# Patient Record
Sex: Male | Born: 1980
Health system: Southern US, Community
[De-identification: ages and names within clinical notes are randomized; demographics above are authoritative.]

## PROBLEM LIST (undated history)

## (undated) DIAGNOSIS — T7840XA Allergy, unspecified, initial encounter: Secondary | ICD-10-CM

## (undated) HISTORY — DX: Allergy, unspecified, initial encounter: T78.40XA

---

## 2002-02-14 ENCOUNTER — Emergency Department (HOSPITAL_COMMUNITY): Admission: EM | Admit: 2002-02-14 | Discharge: 2002-02-14 | Payer: Self-pay | Admitting: Emergency Medicine

## 2005-04-16 ENCOUNTER — Emergency Department (HOSPITAL_COMMUNITY): Admission: EM | Admit: 2005-04-16 | Discharge: 2005-04-16 | Payer: Self-pay | Admitting: Emergency Medicine

## 2010-01-01 ENCOUNTER — Emergency Department (HOSPITAL_COMMUNITY): Admission: EM | Admit: 2010-01-01 | Discharge: 2010-01-01 | Payer: Self-pay | Admitting: Family Medicine

## 2010-09-08 ENCOUNTER — Emergency Department (INDEPENDENT_AMBULATORY_CARE_PROVIDER_SITE_OTHER): Payer: Self-pay

## 2010-09-08 ENCOUNTER — Emergency Department (HOSPITAL_BASED_OUTPATIENT_CLINIC_OR_DEPARTMENT_OTHER)
Admission: EM | Admit: 2010-09-08 | Discharge: 2010-09-08 | Disposition: A | Discharge status: Home / Self Care | Payer: Self-pay | Attending: Emergency Medicine | Admitting: Emergency Medicine

## 2010-09-08 DIAGNOSIS — J189 Pneumonia, unspecified organism: Secondary | ICD-10-CM | POA: Insufficient documentation

## 2010-09-08 DIAGNOSIS — R059 Cough, unspecified: Secondary | ICD-10-CM

## 2010-09-08 DIAGNOSIS — R05 Cough: Secondary | ICD-10-CM | POA: Insufficient documentation

## 2010-09-08 DIAGNOSIS — R0989 Other specified symptoms and signs involving the circulatory and respiratory systems: Secondary | ICD-10-CM

## 2010-09-08 DIAGNOSIS — R509 Fever, unspecified: Secondary | ICD-10-CM

## 2010-09-15 ENCOUNTER — Emergency Department (HOSPITAL_BASED_OUTPATIENT_CLINIC_OR_DEPARTMENT_OTHER)
Admission: EM | Admit: 2010-09-15 | Discharge: 2010-09-15 | Disposition: A | Discharge status: Home / Self Care | Payer: Self-pay | Attending: Emergency Medicine | Admitting: Emergency Medicine

## 2010-09-15 DIAGNOSIS — J189 Pneumonia, unspecified organism: Secondary | ICD-10-CM | POA: Insufficient documentation

## 2011-06-17 ENCOUNTER — Emergency Department (INDEPENDENT_AMBULATORY_CARE_PROVIDER_SITE_OTHER): Payer: Self-pay

## 2011-06-17 ENCOUNTER — Emergency Department (HOSPITAL_BASED_OUTPATIENT_CLINIC_OR_DEPARTMENT_OTHER)
Admission: EM | Admit: 2011-06-17 | Discharge: 2011-06-17 | Disposition: A | Discharge status: Home / Self Care | Payer: Self-pay | Attending: Emergency Medicine | Admitting: Emergency Medicine

## 2011-06-17 ENCOUNTER — Other Ambulatory Visit: Payer: Self-pay

## 2011-06-17 ENCOUNTER — Encounter (HOSPITAL_BASED_OUTPATIENT_CLINIC_OR_DEPARTMENT_OTHER): Payer: Self-pay

## 2011-06-17 DIAGNOSIS — R0602 Shortness of breath: Secondary | ICD-10-CM | POA: Insufficient documentation

## 2011-06-17 DIAGNOSIS — R079 Chest pain, unspecified: Secondary | ICD-10-CM | POA: Insufficient documentation

## 2011-06-17 MED ORDER — IBUPROFEN 800 MG PO TABS
800.0000 mg | ORAL_TABLET | Freq: Once | ORAL | Status: AC
  Administered 2011-06-17: 800 mg via ORAL
  Filled 2011-06-17: qty 1

## 2011-06-17 NOTE — ED Notes (Signed)
C/o CP started last night-intermittent "for a few months"-states pain worse when "bends down"

## 2011-06-17 NOTE — ED Provider Notes (Signed)
This chart was scribed for Joya Gaskins, MD by Wallis Mart. The patient was seen in room MH11/MH11 and the patient's care was started at 6:32 PM.   CSN: [id number]  Arrival date & time 06/17/11  1738   First MD Initiated Contact with Patient 06/17/11 1802      Chief Complaint  Patient presents with  . Chest Pain     HPI  Pt seen at 6:30 PM Xavier Oconnor is a 31 y.o. male who presents to the Emergency Department complaining of persistence of constant, gradually worsening CP onset last night while sleeping. The pain radiates nowhere.  Turning certain ways and bending over worsens the pain. This is a recurrent problem, it comes and goes every couple of months. Pain is currently a level 4 but worsens when moving his upper body. Pain also  worsens while breathing deeply. Pt c/o some SOB. Pt has tried nothing to relieve the pain.  Denies sweating or syncope, fevers, coughing. Pt denies health problems, smoking, drug use. No h/o MI, blood clots. There are no other associated symptoms and no other alleviating or aggravating factors.    PMH - none  History reviewed. No pertinent past surgical history.  No family history on file.  History  Substance Use Topics  . Smoking status: Never Smoker   . Smokeless tobacco: Not on file  . Alcohol Use: Yes      Review of Systems 10 Systems reviewed and are negative for acute change except as noted in the HPI.  Allergies  Review of patient's allergies indicates no known allergies.  Home Medications  No current outpatient prescriptions on file.  BP 108/75  Pulse 71  Temp(Src) 98.5 F (36.9 C) (Oral)  Resp 16  Ht 5\' 7"  (1.702 m)  Wt 204 lb (92.534 kg)  BMI 31.95 kg/m2  SpO2 98%  Physical Exam CONSTITUTIONAL: Well developed/well nourished HEAD AND FACE: Normocephalic/atraumatic EYES: EOMI/PERRL ENMT: Mucous membranes moist NECK: supple no meningeal signs SPINE:entire spine nontender CV: S1/S2 noted, no  murmurs/rubs/gallops noted LUNGS: Lungs are clear to auscultation bilaterally, no apparent distress CHEST: tenderness with movement of upper body ABDOMEN: soft, nontender, no rebound or guarding GU:no cva tenderness NEURO: Pt is awake/alert, moves all extremitiesx4 EXTREMITIES: pulses normal, full ROM, no edema noted SKIN: warm, color normal PSYCH: no abnormalities of mood noted  ED Course  Procedures  DIAGNOSTIC STUDIES: Oxygen Saturation is 98% on room air, normal by my interpretation.    COORDINATION OF CARE:   Pt well appearing, no distress Doubt ACS/PE/Dissection His pain is reproducible  The patient appears reasonably screened and/or stabilized for discharge and I doubt any other medical condition or other [facility] requiring further screening, evaluation, or treatment in the ED at this time prior to discharge.   MDM  Nursing notes reviewed and considered in documentation xrays reviewed and considered    Date: 06/17/2011  Rate: 58  Rhythm: normal sinus rhythm  QRS Axis: normal  Intervals: normal  ST/T Wave abnormalities: normal  Conduction Disutrbances:none  Narrative Interpretation:   Old EKG Reviewed: unchanged     I personally performed the services described in this documentation, which was scribed in my presence. The recorded information has been reviewed and considered.       Joya Gaskins, MD 06/17/11 1929

## 2011-06-17 NOTE — Discharge Instructions (Signed)

## 2011-08-26 ENCOUNTER — Emergency Department (HOSPITAL_BASED_OUTPATIENT_CLINIC_OR_DEPARTMENT_OTHER)
Admission: EM | Admit: 2011-08-26 | Discharge: 2011-08-26 | Disposition: A | Discharge status: Home / Self Care | Payer: Self-pay | Attending: Emergency Medicine | Admitting: Emergency Medicine

## 2011-08-26 ENCOUNTER — Encounter (HOSPITAL_BASED_OUTPATIENT_CLINIC_OR_DEPARTMENT_OTHER): Payer: Self-pay | Admitting: *Deleted

## 2011-08-26 DIAGNOSIS — R197 Diarrhea, unspecified: Secondary | ICD-10-CM | POA: Insufficient documentation

## 2011-08-26 LAB — CBC
HCT: 41 % (ref 39.0–52.0)
Hemoglobin: 14.8 g/dL (ref 13.0–17.0)
MCH: 30.6 pg (ref 26.0–34.0)
MCHC: 36.1 g/dL — ABNORMAL HIGH (ref 30.0–36.0)
MCV: 84.7 fL (ref 78.0–100.0)
Platelets: 230 10*3/uL (ref 150–400)
RBC: 4.84 MIL/uL (ref 4.22–5.81)
RDW: 12.4 % (ref 11.5–15.5)
WBC: 9.9 10*3/uL (ref 4.0–10.5)

## 2011-08-26 LAB — COMPREHENSIVE METABOLIC PANEL
ALT: 33 U/L (ref 0–53)
AST: 21 U/L (ref 0–37)
Albumin: 4.3 g/dL (ref 3.5–5.2)
Alkaline Phosphatase: 87 U/L (ref 39–117)
BUN: 13 mg/dL (ref 6–23)
CO2: 28 mEq/L (ref 19–32)
Calcium: 9.7 mg/dL (ref 8.4–10.5)
Chloride: 102 mEq/L (ref 96–112)
Creatinine, Ser: 1 mg/dL (ref 0.50–1.35)
GFR calc Af Amer: >90 mL/min (ref >90)
GFR calc non Af Amer: >90 mL/min (ref >90)
Glucose, Bld: 111 mg/dL — ABNORMAL HIGH (ref 70–99)
Potassium: 4.3 meq/L (ref 3.5–5.1)
Sodium: 139 mEq/L (ref 135–145)
Total Bilirubin: 0.7 mg/dL (ref 0.3–1.2)
Total Protein: 7.5 g/dL (ref 6.0–8.3)

## 2011-08-26 LAB — URINE MICROSCOPIC-ADD ON

## 2011-08-26 LAB — URINALYSIS, ROUTINE W REFLEX MICROSCOPIC
Bilirubin Urine: NEGATIVE
Glucose, UA: NEGATIVE mg/dL
Hgb urine dipstick: NEGATIVE
Ketones, ur: 15 mg/dL — AB
Nitrite: NEGATIVE
Protein, ur: NEGATIVE mg/dL
Specific Gravity, Urine: 1.03 (ref 1.005–1.030)
Urobilinogen, UA: 0.2 mg/dL (ref 0.0–1.0)
pH: 6.5 (ref 5.0–8.0)

## 2011-08-26 LAB — DIFFERENTIAL
Basophils Absolute: 0 10*3/uL (ref 0.0–0.1)
Basophils Relative: 0 % (ref 0–1)
Eosinophils Absolute: 0.1 10*3/uL (ref 0.0–0.7)
Eosinophils Relative: 1 % (ref 0–5)
Lymphocytes Relative: 11 % — ABNORMAL LOW (ref 12–46)
Lymphs Abs: 1.1 10*3/uL (ref 0.7–4.0)
Monocytes Absolute: 1.1 10*3/uL — ABNORMAL HIGH (ref 0.1–1.0)
Monocytes Relative: 11 % (ref 3–12)
Neutro Abs: 7.5 10*3/uL (ref 1.7–7.7)
Neutrophils Relative %: 76 % (ref 43–77)

## 2011-08-26 LAB — LIPASE, BLOOD: Lipase: 13 U/L (ref 11–59)

## 2011-08-26 MED ORDER — ONDANSETRON 8 MG PO TBDP: 8.0000 mg | ORAL_TABLET | Freq: Three times a day (TID) | ORAL | Status: AC | PRN

## 2011-08-26 MED ORDER — ONDANSETRON HCL 4 MG/2ML IJ SOLN
4.0000 mg | Freq: Once | INTRAMUSCULAR | Status: AC
  Administered 2011-08-26: 4 mg via INTRAVENOUS
  Filled 2011-08-26: qty 2

## 2011-08-26 MED ORDER — ONDANSETRON HCL 4 MG/2ML IJ SOLN: 4.0000 mg | Freq: Once | INTRAMUSCULAR | Status: DC

## 2011-08-26 MED ORDER — SODIUM CHLORIDE 0.9 % IV BOLUS (SEPSIS)
1000.0000 mL | Freq: Once | INTRAVENOUS | Status: AC
  Administered 2011-08-26: 1000 mL via INTRAVENOUS

## 2011-08-26 NOTE — ED Notes (Signed)
Pt reports having diarrhea since 2030 last evening. Pt reports having 8 episodes of diarrhea. Reports mid abdominal pain, squeezing in nature and is 6/10. Pt reports nausea, but no vomiting. Pt reports not taking any OTC meds for the diarrhea or abdominal pain.

## 2011-08-26 NOTE — ED Provider Notes (Signed)
History     CSN: [id number]  Arrival date & time 08/26/11  4098   First MD Initiated Contact with Patient 08/26/11 0528      No chief complaint on file.   (Consider location/radiation/quality/duration/timing/severity/associated sxs/prior treatment) HPI Patient is a 31 year old male who presents today complaining of having 6-8 bowel movements since 10:30 last night. Patient began having abdominal pain that he described as a cramping sensation around 8 PM. He the decubitus steak for dinner and no one else to eat the same meal had any symptoms. The patient did note that he had also eaten some ice cream which no one else ate and his symptoms did begin after this. The patient endorses having intermittent crampy abdominal pain that he described as being an 8/10. He has had nausea but no vomiting. Patient noted that he had had a small amount of blood on the toilet paper but no significant blood noted in his stool in the toilet. Patient denies any sick contacts. He's had no fevers. On presentation patient is hemodynamically stable. Patient has no history of abdominal surgery. He denies any urinary symptoms. Currently patient has pain is only a 3/10. There are no other associated or modifying factors. History reviewed. No pertinent past medical history.  History reviewed. No pertinent past surgical history.  History reviewed. No pertinent family history.  History  Substance Use Topics  . Smoking status: Never Smoker   . Smokeless tobacco: Not on file  . Alcohol Use: Yes      Review of Systems  Constitutional: Negative.   HENT: Negative.   Eyes: Negative.   Respiratory: Negative.   Cardiovascular: Negative.   Gastrointestinal: Positive for nausea, abdominal pain and diarrhea. Negative for vomiting.  Genitourinary: Negative.   Musculoskeletal: Negative.   Skin: Negative.   Neurological: Negative.   Hematological: Negative.   Psychiatric/Behavioral: Negative.   All other systems  reviewed and are negative.    Allergies  Review of patient's allergies indicates no known allergies.  Home Medications  No current outpatient prescriptions on file.  BP 127/70  Pulse 67  Temp(Src) 98.5 F (36.9 C) (Oral)  Resp 16  Ht 5\' 7"  (1.702 m)  Wt 200 lb (90.719 kg)  BMI 31.32 kg/m2  SpO2 97%  Physical Exam  Nursing note and vitals reviewed. GEN: Well-developed, well-nourished male in no distress HEENT: Atraumatic, normocephalic. Oropharynx clear without erythema EYES: PERRLA BL, no scleral icterus. NECK: Trachea midline, no meningismus CV: regular rate and rhythm. No murmurs, rubs, or gallops PULM: No respiratory distress.  No crackles, wheezes, or rales. GI: soft, non-tender. No guarding, rebound, or tenderness. + bowel sounds  GU: deferred Neuro: cranial nerves 2-12 intact, no abnormalities of strength or sensation, A and O x 3 MSK: Patient moves all 4 extremities symmetrically, no deformity, edema, or injury noted Skin: No rashes petechiae, purpura, or jaundice Psych: no abnormality of mood   ED Course  Procedures (including critical care time)  Labs Reviewed  CBC - Abnormal; Notable for the following:    MCHC 36.1 (*)    All other components within normal limits  DIFFERENTIAL - Abnormal; Notable for the following:    Lymphocytes Relative 11 (*)    Monocytes Absolute 1.1 (*)    All other components within normal limits  COMPREHENSIVE METABOLIC PANEL - Abnormal; Notable for the following:    Glucose, Bld 111 (*)    All other components within normal limits  URINALYSIS, ROUTINE W REFLEX MICROSCOPIC - Abnormal; Notable for the following:  Ketones, ur 15 (*)    Leukocytes, UA SMALL (*)    All other components within normal limits  LIPASE, BLOOD  URINE MICROSCOPIC-ADD ON   No results found.   1. Diarrhea       MDM  Patient was evaluated by myself. Based on evaluation patient was given IV fluids as well as a dose of Zofran. Abdominal pain workup  with labs was performed. Patient had normal renal panel, liver panel, lipase. Urinalysis showed 15 ketones but no significant signs of infection. Patient did have 11-20 white blood cells in his urine but urine showed only small leuk esterase. Patient did not have any urinary symptoms. Following hydration patient was feeling better. He had no abdominal pain.  The patient remained hemodynamically stable. During his hour and a half in the emergency department he did not have a single episode of diarrhea. We discussed that his symptoms may have been due to lactose intolerance from the ice cream he ate or possibly from an episode of food poisoning associated with this.  As he did not have vomiting I did not feel this was a gastroenteritis. Patient was not felt to have a surgical process today. Imaging was not warranted on this visit. Patient had discussed that he could be discharged home with a prescription for Zofran if needed for his nausea. He was not nauseous at discharge. Patient is welcome to return if he has worsening of his symptoms or other emergent concerns.        Cyndra Numbers, MD 08/26/11 [phone]

## 2011-08-26 NOTE — Discharge Instructions (Signed)
Diarrhea Infections caused by germs (bacterial) or a virus commonly cause diarrhea. Your caregiver has determined that with time, rest and fluids, the diarrhea should improve. In general, eat normally while drinking more water than usual. Although water may prevent dehydration, it does not contain salt and minerals (electrolytes). Broths, weak tea without caffeine and oral rehydration solutions (ORS) replace fluids and electrolytes. Small amounts of fluids should be taken frequently. Large amounts at one time may not be tolerated. Plain water may be harmful in infants and the elderly. Oral rehydrating solutions (ORS) are available at pharmacies and grocery stores. ORS replace water and important electrolytes in proper proportions. Sports drinks are not as effective as ORS and may be harmful due to sugars worsening diarrhea.  ORS is especially recommended for use in children with diarrhea. As a general guideline for children, replace any new fluid losses from diarrhea and/or vomiting with ORS as follows:   If your child weighs 22 pounds or under (10 kg or less), give 60-120 mL ( -  cup or 2 - 4 ounces) of ORS for each episode of diarrheal stool or vomiting episode.   If your child weighs more than 22 pounds (more than 10 kgs), give 120-240 mL ( - 1 cup or 4 - 8 ounces) of ORS for each diarrheal stool or episode of vomiting.   While correcting for dehydration, children should eat normally. However, foods high in sugar should be avoided because this may worsen diarrhea. Large amounts of carbonated soft drinks, juice, gelatin desserts and other highly sugared drinks should be avoided.   After correction of dehydration, other liquids that are appealing to the child may be added. Children should drink small amounts of fluids frequently and fluids should be increased as tolerated. Children should drink enough fluids to keep urine clear or pale yellow.   Adults should eat normally while drinking more fluids  than usual. Drink small amounts of fluids frequently and increase as tolerated. Drink enough fluids to keep urine clear or pale yellow. Broths, weak decaffeinated tea, lemon lime soft drinks (allowed to go flat) and ORS replace fluids and electrolytes.   Avoid:   Carbonated drinks.   Juice.   Extremely hot or cold fluids.   Caffeine drinks.   Fatty, greasy foods.   Alcohol.   Tobacco.   Too much intake of anything at one time.   Gelatin desserts.   Probiotics are active cultures of beneficial bacteria. They may lessen the amount and number of diarrheal stools in adults. Probiotics can be found in yogurt with active cultures and in supplements.   Wash hands well to avoid spreading bacteria and virus.   Anti-diarrheal medications are not recommended for infants and children.   Only take over-the-counter or prescription medicines for pain, discomfort or fever as directed by your caregiver. Do not give aspirin to children because it may cause Reye's Syndrome.   For adults, ask your caregiver if you should continue all prescribed and over-the-counter medicines.   If your caregiver has given you a follow-up appointment, it is very important to keep that appointment. Not keeping the appointment could result in a chronic or permanent injury, and disability. If there is any problem keeping the appointment, you must call back to this facility for assistance.  SEEK IMMEDIATE MEDICAL CARE IF:   You or your child is unable to keep fluids down or other symptoms or problems become worse in spite of treatment.   Vomiting or diarrhea develops and becomes persistent.     There is vomiting of blood or bile (green material).   There is blood in the stool or the stools are black and tarry.   There is no urine output in 6-8 hours or there is only a small amount of very dark urine.   Abdominal pain develops, increases or localizes.   You have a fever.   Your baby is older than 3 months with a  rectal temperature of 102 F (38.9 C) or higher.   Your baby is 3 months old or younger with a rectal temperature of 100.4 F (38 C) or higher.   You or your child develops excessive weakness, dizziness, fainting or extreme thirst.   You or your child develops a rash, stiff neck, severe headache or become irritable or sleepy and difficult to awaken.  MAKE SURE YOU:   Understand these instructions.   Will watch your condition.   Will get help right away if you are not doing well or get worse.  Document Released: 03/15/2002 Document Revised: 03/14/2011 Document Reviewed: 01/30/2009 [employer] Patient Information 2012 [employer]. 

## 2011-08-26 NOTE — ED Notes (Signed)
Pt reports 6-8 episodes of diarrhea since 2030 last evening. States that he has not taken anything for the diarrhea. Pt reports nausea but no vomiting.

## 2012-01-12 ENCOUNTER — Encounter (HOSPITAL_BASED_OUTPATIENT_CLINIC_OR_DEPARTMENT_OTHER): Payer: Self-pay

## 2012-01-12 ENCOUNTER — Emergency Department (HOSPITAL_BASED_OUTPATIENT_CLINIC_OR_DEPARTMENT_OTHER)
Admission: EM | Admit: 2012-01-12 | Discharge: 2012-01-13 | Disposition: A | Discharge status: Home / Self Care | Payer: Self-pay | Attending: Emergency Medicine | Admitting: Emergency Medicine

## 2012-01-12 DIAGNOSIS — X500XXA Overexertion from strenuous movement or load, initial encounter: Secondary | ICD-10-CM | POA: Insufficient documentation

## 2012-01-12 DIAGNOSIS — S339XXA Sprain of unspecified parts of lumbar spine and pelvis, initial encounter: Secondary | ICD-10-CM | POA: Insufficient documentation

## 2012-01-12 DIAGNOSIS — S39012A Strain of muscle, fascia and tendon of lower back, initial encounter: Secondary | ICD-10-CM

## 2012-01-12 DIAGNOSIS — R29898 Other symptoms and signs involving the musculoskeletal system: Secondary | ICD-10-CM | POA: Insufficient documentation

## 2012-01-12 DIAGNOSIS — M5380 Other specified dorsopathies, site unspecified: Secondary | ICD-10-CM | POA: Insufficient documentation

## 2012-01-12 MED ORDER — OXYCODONE-ACETAMINOPHEN 7.5-325 MG PO TABS: 1.0000 | ORAL_TABLET | ORAL | Status: DC | PRN

## 2012-01-12 MED ORDER — DIAZEPAM 10 MG PO TABS: 10.0000 mg | ORAL_TABLET | Freq: Three times a day (TID) | ORAL | Status: DC | PRN

## 2012-01-12 MED ORDER — NAPROXEN 500 MG PO TABS: 500.0000 mg | ORAL_TABLET | Freq: Two times a day (BID) | ORAL | Status: DC

## 2012-01-12 MED ORDER — KETOROLAC TROMETHAMINE 60 MG/2ML IM SOLN
60.0000 mg | Freq: Once | INTRAMUSCULAR | Status: AC
  Administered 2012-01-12: 60 mg via INTRAMUSCULAR

## 2012-01-12 MED ORDER — IBUPROFEN 800 MG PO TABS
800.0000 mg | ORAL_TABLET | Freq: Once | ORAL | Status: AC
Start: 2012-01-12 — End: 2012-01-12
  Administered 2012-01-12: 800 mg via ORAL
  Filled 2012-01-12: qty 1

## 2012-01-12 MED ORDER — CYCLOBENZAPRINE HCL 10 MG PO TABS
10.0000 mg | ORAL_TABLET | Freq: Once | ORAL | Status: AC
  Administered 2012-01-12: 10 mg via ORAL
  Filled 2012-01-12: qty 1

## 2012-01-12 MED ORDER — OXYCODONE-ACETAMINOPHEN 5-325 MG PO TABS
1.0000 | ORAL_TABLET | Freq: Once | ORAL | Status: AC
  Administered 2012-01-12: 1 via ORAL
  Filled 2012-01-12 (×2): qty 1

## 2012-01-12 MED ORDER — KETOROLAC TROMETHAMINE 60 MG/2ML IM SOLN
INTRAMUSCULAR | Status: AC
  Filled 2012-01-12: qty 2

## 2012-01-12 MED ORDER — OXYCODONE-ACETAMINOPHEN 5-325 MG PO TABS
1.0000 | ORAL_TABLET | Freq: Once | ORAL | Status: AC
  Administered 2012-01-13: 1 via ORAL
  Filled 2012-01-12 (×2): qty 1

## 2012-01-12 NOTE — ED Provider Notes (Signed)
History  This chart was scribed for Xavier Booze, MD by Lynelle Smoke and Shari Heritage. The patient was seen in room MHT13/MHT13. Patient's care was started at 2232.   CSN: [id number]  Arrival date & time 01/12/12  1941   First MD Initiated Contact with Patient 01/12/12 2232      Chief Complaint  Patient presents with  . Back Injury    The history is provided by the patient. No language interpreter was used.    HPI Comments: Xavier Oconnor is a 31 y.o. male who presents to the Emergency Department complaining of moderate, constant, non-radiating mid lower back pain onset 5.5 hours ago. There is associated mild leg weakness and tingling. Patient states that he strained a muscle while playing with his kids. Patient rates the pain as 4/10 when he is at rest. Patient denies urinary incontinence or bowel incontinence. Patient was transported to the ED by EMS because he had trouble standing and moving freely after the incident. Patient says that movement worsens the pain. Patient states that he has a history of back pain. Patient does not smoke.  History reviewed. No pertinent past medical history.  History reviewed. No pertinent past surgical history.  No family history on file.  History  Substance Use Topics  . Smoking status: Never Smoker   . Smokeless tobacco: Not on file  . Alcohol Use: Yes      Review of Systems  Musculoskeletal: Positive for back pain.  Neurological: Positive for weakness.  All other systems reviewed and are negative.    Allergies  Review of patient's allergies indicates no known allergies.  Home Medications  No current outpatient prescriptions on file.  BP 124/80  Pulse 84  Temp 98.3 F (36.8 C) (Oral)  Resp 20  SpO2 94%  Physical Exam  Nursing note and vitals reviewed. Constitutional: He is oriented to person, place, and time. He appears well-developed and well-nourished.  HENT:  Head: Normocephalic and atraumatic.  Neck: Normal range of  motion. Neck supple.  Musculoskeletal: He exhibits tenderness.       Lumbar back: He exhibits tenderness and spasm.       Moderate left paralumbar spasm with tenderness. Mild tenderness lower lumbar spine. Positive straight leg raise bilaterally at 30 degrees.  Neurological: He is alert and oriented to person, place, and time.  Skin: Skin is warm and dry.  Psychiatric: He has a normal mood and affect. His behavior is normal.    ED Course  Procedures (including critical care time) DIAGNOSTIC STUDIES: Oxygen Saturation is 94% on room air, adequate by my interpretation.    COORDINATION OF CARE: 10:39pm- Patient informed of current plan for treatment and evaluation and agrees with plan at this time.     1. Lumbosacral strain       MDM  Acute lumbar strain with significant muscle spasm. There is no indication for imaging at this point. He is given a dose of ibuprofen, Percocet, and Flexeril and will be reevaluated.  He states that he got only slight relief for the above-noted medications. He is given an additional dose of Percocet and he is discharged with prescriptions for Percocet 7.5-325, diazepam, and naproxen.      I personally performed the services described in this documentation, which was scribed in my presence. The recorded information has been reviewed and considered.      Xavier Booze, MD 01/13/12 Marlyne Beards

## 2012-01-12 NOTE — ED Notes (Signed)
Patient arrived by EMS after complaining of lower backpain after playing and wrestling with children. Patient reports that he twisted the wrong way and is now having severe back pain, increased pain with any movement, no neuro deficits

## 2012-01-13 NOTE — Discharge Instructions (Signed)
Back Pain, Adult Low back pain is very common. About 1 in 5 people have back pain.The cause of low back pain is rarely dangerous. The pain often gets better over time.About half of people with a sudden onset of back pain feel better in just 2 weeks. About 8 in 10 people feel better by 6 weeks.  CAUSES Some common causes of back pain include:  Strain of the muscles or ligaments supporting the spine.  Wear and tear (degeneration) of the spinal discs.  Arthritis.  Direct injury to the back. DIAGNOSIS Most of the time, the direct cause of low back pain is not known.However, back pain can be treated effectively even when the exact cause of the pain is unknown.Answering your caregiver's questions about your overall health and symptoms is one of the most accurate ways to make sure the cause of your pain is not dangerous. If your caregiver needs more information, he or she may order lab work or imaging tests (X-rays or MRIs).However, even if imaging tests show changes in your back, this usually does not require surgery. HOME CARE INSTRUCTIONS For many people, back pain returns.Since low back pain is rarely dangerous, it is often a condition that people can learn to [facility] their own.   Remain active. It is stressful on the back to sit or stand in one place. Do not sit, drive, or stand in one place for more than 30 minutes at a time. Take short walks on level surfaces as soon as pain allows.Try to increase the length of time you walk each day.  Do not stay in bed.Resting more than 1 or 2 days can delay your recovery.  Do not avoid exercise or work.Your body is made to move.It is not dangerous to be active, even though your back may hurt.Your back will likely heal faster if you return to being active before your pain is gone.  Pay attention to your body when you bend and lift. Many people have less discomfortwhen lifting if they bend their knees, keep the load close to their bodies,and  avoid twisting. Often, the most comfortable positions are those that put less stress on your recovering back.  Find a comfortable position to sleep. Use a firm mattress and lie on your side with your knees slightly bent. If you lie on your back, put a pillow under your knees.  Only take over-the-counter or prescription medicines as directed by your caregiver. Over-the-counter medicines to reduce pain and inflammation are often the most helpful.Your caregiver may prescribe muscle relaxant drugs.These medicines help dull your pain so you can more quickly return to your normal activities and healthy exercise.  Put ice on the injured area.  Put ice in a plastic bag.  Place a towel between your skin and the bag.  Leave the ice on for 15 to 20 minutes, 3 to 4 times a day for the first 2 to 3 days. After that, ice and heat may be alternated to reduce pain and spasms.  Ask your caregiver about trying back exercises and gentle massage. This may be of some benefit.  Avoid feeling anxious or stressed.Stress increases muscle tension and can worsen back pain.It is important to recognize when you are anxious or stressed and learn ways to manage it.Exercise is a great option. SEEK MEDICAL CARE IF:  You have pain that is not relieved with rest or medicine.  You have pain that does not improve in 1 week.  You have new symptoms.  You are generally  not feeling well. SEEK IMMEDIATE MEDICAL CARE IF:   You have pain that radiates from your back into your legs.  You develop new bowel or bladder control problems.  You have unusual weakness or numbness in your arms or legs.  You develop nausea or vomiting.  You develop abdominal pain.  You feel faint. Document Released: 03/25/2005 Document Revised: 09/24/2011 Document Reviewed: 08/13/2010 [facility] Patient Information 2013 [place], [place].  Naproxen and naproxen sodium oral immediate-release tablets What is this medicine? NAPROXEN (na PROX en)  is a non-steroidal anti-inflammatory drug (NSAID). It is used to reduce swelling and to treat pain. This medicine may be used for dental pain, headache, or painful monthly periods. It is also used for painful joint and muscular problems such as arthritis, tendinitis, bursitis, and gout. This medicine may be used for other purposes; ask your health care provider or pharmacist if you have questions. What should I tell my health care provider before I take this medicine? They need to know if you have any of these conditions: -asthma -cigarette smoker -drink more than 3 alcohol containing drinks a day -heart disease or circulation problems such as heart failure or leg edema (fluid retention) -high blood pressure -kidney disease -liver disease -stomach bleeding or ulcers -an unusual or allergic reaction to naproxen, aspirin, other NSAIDs, other medicines, foods, dyes, or preservatives -pregnant or trying to get pregnant -breast-feeding How should I use this medicine? Take this medicine by mouth with a glass of water. Follow the directions on the prescription label. Take it with food if your stomach gets upset. Try to not lie down for at least 10 minutes after you take it. Take your medicine at regular intervals. Do not take your medicine more often than directed. Long-term, continuous use may increase the risk of heart attack or stroke. A special MedGuide will be given to you by the pharmacist with each prescription and refill. Be sure to read this information carefully each time. Talk to your pediatrician regarding the use of this medicine in children. Special care may be needed. Overdosage: If you think you have taken too much of this medicine contact a poison control center or emergency room at once. NOTE: This medicine is only for you. Do not share this medicine with others. What if I miss a dose? If you miss a dose, take it as soon as you can. If it is almost time for your next dose, take only  that dose. Do not take double or extra doses. What may interact with this medicine? -alcohol -aspirin -cidofovir -diuretics -lithium -methotrexate -other drugs for inflammation like ketorolac or prednisone -pemetrexed -probenecid -warfarin This list may not describe all possible interactions. Give your health care provider a list of all the medicines, herbs, non-prescription drugs, or dietary supplements you use. Also tell them if you smoke, drink alcohol, or use illegal drugs. Some items may interact with your medicine. What should I watch for while using this medicine? Tell your doctor or health care professional if your pain does not get better. Talk to your doctor before taking another medicine for pain. Do not treat yourself. This medicine does not prevent heart attack or stroke. In fact, this medicine may increase the chance of a heart attack or stroke. The chance may increase with longer use of this medicine and in people who have heart disease. If you take aspirin to prevent heart attack or stroke, talk with your doctor or health care professional. Do not take other medicines that contain aspirin,  ibuprofen, or naproxen with this medicine. Side effects such as stomach upset, nausea, or ulcers may be more likely to occur. Many medicines available without a prescription should not be taken with this medicine. This medicine can cause ulcers and bleeding in the stomach and intestines at any time during treatment. Do not smoke cigarettes or drink alcohol. These increase irritation to your stomach and can make it more susceptible to damage from this medicine. Ulcers and bleeding can happen without warning symptoms and can cause death. You may get drowsy or dizzy. Do not drive, use machinery, or do anything that needs mental alertness until you know how this medicine affects you. Do not stand or sit up quickly, especially if you are an older patient. This reduces the risk of dizzy or fainting  spells. This medicine can cause you to bleed more easily. Try to avoid damage to your teeth and gums when you brush or floss your teeth. What side effects may I notice from receiving this medicine? Side effects that you should report to your doctor or health care professional as soon as possible: -black or bloody stools, blood in the urine or vomit -blurred vision -chest pain -difficulty breathing or wheezing -nausea or vomiting -severe stomach pain -skin rash, skin redness, blistering or peeling skin, hives, or itching -slurred speech or weakness on one side of the body -swelling of eyelids, throat, lips -unexplained weight gain or swelling -unusually weak or tired -yellowing of eyes or skin Side effects that usually do not require medical attention (report to your doctor or health care professional if they continue or are bothersome): -constipation -headache -heartburn This list may not describe all possible side effects. Call your doctor for medical advice about side effects. You may report side effects to FDA at 1-800-FDA-1088. Where should I keep my medicine? Keep out of the reach of children. Store at room temperature between 15 and 30 degrees C (59 and 86 degrees F). Keep container tightly closed. Throw away any unused medicine after the expiration date. NOTE: This sheet is a summary. It may not cover all possible information. If you have questions about this medicine, talk to your doctor, pharmacist, or health care provider.  2012, [employer] Standard. (03/27/2009 8:10:16 PM)  Diazepam tablets What is this medicine? DIAZEPAM (dye AZ e pam) is a benzodiazepine. It is used to treat anxiety and nervousness. It also can help treat alcohol withdrawal, relax muscles, and treat certain types of seizures. This medicine may be used for other purposes; ask your health care provider or pharmacist if you have questions. What should I tell my health care provider before I take this  medicine? They need to know if you have any of these conditions -an alcohol or drug abuse problem -bipolar disorder, depression, psychosis or other mental health condition -glaucoma -kidney or liver disease -lung or breathing disease -myasthenia gravis -Parkinson's disease -seizures or a history of seizures -suicidal thoughts -an unusual or allergic reaction to diazepam, other benzodiazepines, foods, dyes, or preservatives -pregnant or trying to get pregnant -breast-feeding How should I use this medicine? Take this medicine by mouth with a glass of water. Follow the directions on the prescription label. If this medicine upsets your stomach, take it with food or milk. Take your doses at regular intervals. Do not take your medicine more often than directed. If you have been taking this medicine regularly for some time, do not suddenly stop taking it. You must gradually reduce the dose or you may get severe side  effects. Ask your doctor or health care professional for advice. Even after you stop taking this medicine it can still affect your body for several days. Talk to your pediatrician regarding the use of this medicine in children. Special care may be needed. Overdosage: If you think you have taken too much of this medicine contact a poison control center or emergency room at once. NOTE: This medicine is only for you. Do not share this medicine with others. What if I miss a dose? If you miss a dose, take it as soon as you can. If it is almost time for your next dose, take only that dose. Do not take double or extra doses. What may interact with this medicine? -cimetidine -grapefruit juice -herbal or dietary supplements like kava kava, melatonin, St. John's Wort, or valerian -medicines for anxiety or sleeping problems, like alprazolam, lorazepam, or triazolam -medicines for depression, mental problems or psychiatric disturbances -medicines for HIV infection or AIDS -prescription pain  medicines -rifampin, rifapentine, or rifabutin -some medicines for seizures like carbamazepine, phenobarbital, phenytoin, or primidone This list may not describe all possible interactions. Give your health care provider a list of all the medicines, herbs, non-prescription drugs, or dietary supplements you use. Also tell them if you smoke, drink alcohol, or use illegal drugs. Some items may interact with your medicine. What should I watch for while using this medicine? Visit your doctor or health care professional for regular checks on your progress. Your body can become dependent on this medicine. Ask your doctor or health care professional if you still need to take it. You may get drowsy or dizzy. Do not drive, use machinery, or do anything that needs mental alertness until you know how this medicine affects you. To reduce the risk of dizzy and fainting spells, do not stand or sit up quickly, especially if you are an older patient. Alcohol may increase dizziness and drowsiness. Avoid alcoholic drinks. Do not treat yourself for coughs, colds or allergies without asking your doctor or health care professional for advice. Some ingredients can increase possible side effects. What side effects may I notice from receiving this medicine? Side effects that you should report to your doctor or health care professional as soon as possible: -allergic reactions like skin rash, itching or hives, swelling of the face, lips, or tongue -angry, confused, depressed, other mood changes -breathing problems -feeling faint or lightheaded, falls -muscle cramps -problems with balance, talking, walking -restlessness -tremors -trouble passing urine or change in the amount of urine -unusually weak or tired Side effects that usually do not require medical attention (report to your doctor or health care professional if they continue or are bothersome): -difficulty sleeping, nightmares -dizziness, drowsiness, clumsiness, or  unsteadiness, a hangover effect -headache -nausea, vomiting This list may not describe all possible side effects. Call your doctor for medical advice about side effects. You may report side effects to FDA at 1-800-FDA-1088. Where should I keep my medicine? Keep out of the reach of children. This medicine can be abused. Keep your medicine in a safe place to protect it from theft. Do not share this medicine with anyone. Selling or giving away this medicine is dangerous and against the law. Store at room temperature between 15 and 30 degrees C (59 and 86 degrees F). Protect from light. Keep container tightly closed. Throw away any unused medicine after the expiration date. NOTE: This sheet is a summary. It may not cover all possible information. If you have questions about this medicine, talk  to your doctor, pharmacist, or health care provider.  2012, [employer] Standard. (07/13/2007 4:57:35 PM)  Acetaminophen; Oxycodone tablets What is this medicine? ACETAMINOPHEN; OXYCODONE (a set a MEE noe fen; ox i KOE done) is a pain reliever. It is used to treat mild to moderate pain. This medicine may be used for other purposes; ask your health care provider or pharmacist if you have questions. What should I tell my health care provider before I take this medicine? They need to know if you have any of these conditions: -brain tumor -Crohn's disease, inflammatory bowel disease, or ulcerative colitis -drink more than 3 alcohol containing drinks per day -drug abuse or addiction -head injury -heart or circulation problems -kidney disease or problems going to the bathroom -liver disease -lung disease, asthma, or breathing problems -an unusual or allergic reaction to acetaminophen, oxycodone, other opioid analgesics, other medicines, foods, dyes, or preservatives -pregnant or trying to get pregnant -breast-feeding How should I use this medicine? Take this medicine by mouth with a full glass of water.  Follow the directions on the prescription label. Take your medicine at regular intervals. Do not take your medicine more often than directed. Talk to your pediatrician regarding the use of this medicine in children. Special care may be needed. Patients over 58 years old may have a stronger reaction and need a smaller dose. Overdosage: If you think you have taken too much of this medicine contact a poison control center or emergency room at once. NOTE: This medicine is only for you. Do not share this medicine with others. What if I miss a dose? If you miss a dose, take it as soon as you can. If it is almost time for your next dose, take only that dose. Do not take double or extra doses. What may interact with this medicine? -alcohol or medicines that contain alcohol -antihistamines -barbiturates like amobarbital, butalbital, butabarbital, methohexital, pentobarbital, phenobarbital, thiopental, and secobarbital -benztropine -drugs for bladder problems like solifenacin, trospium, oxybutynin, tolterodine, hyoscyamine, and methscopolamine -drugs for breathing problems like ipratropium and tiotropium -drugs for certain stomach or intestine problems like propantheline, homatropine methylbromide, glycopyrrolate, atropine, belladonna, and dicyclomine -general anesthetics like etomidate, ketamine, nitrous oxide, propofol, desflurane, enflurane, halothane, isoflurane, and sevoflurane -medicines for depression, anxiety, or psychotic disturbances -medicines for pain like codeine, morphine, pentazocine, buprenorphine, butorphanol, nalbuphine, tramadol, and propoxyphene -medicines for sleep -muscle relaxants -naltrexone -phenothiazines like perphenazine, thioridazine, chlorpromazine, mesoridazine, fluphenazine, prochlorperazine, promazine, and trifluoperazine -scopolamine -trihexyphenidyl This list may not describe all possible interactions. Give your health care provider a list of all the medicines, herbs,  non-prescription drugs, or dietary supplements you use. Also tell them if you smoke, drink alcohol, or use illegal drugs. Some items may interact with your medicine. What should I watch for while using this medicine? Tell your doctor or health care professional if your pain does not go away, if it gets worse, or if you have new or a different type of pain. You may develop tolerance to the medicine. Tolerance means that you will need a higher dose of the medication for pain relief. Tolerance is normal and is expected if you take this medicine for a long time. Do not suddenly stop taking your medicine because you may develop a severe reaction. Your body becomes used to the medicine. This does NOT mean you are addicted. Addiction is a behavior related to getting and using a drug for a nonmedical reason. If you have pain, you have a medical reason to take pain medicine. Your doctor  will tell you how much medicine to take. If your doctor wants you to stop the medicine, the dose will be slowly lowered over time to avoid any side effects. You may get drowsy or dizzy. Do not drive, use machinery, or do anything that needs mental alertness until you know how this medicine affects you. Do not stand or sit up quickly, especially if you are an older patient. This reduces the risk of dizzy or fainting spells. Alcohol may interfere with the effect of this medicine. Avoid alcoholic drinks. The medicine will cause constipation. Try to have a bowel movement at least every 2 to 3 days. If you do not have a bowel movement for 3 days, call your doctor or health care professional. Do not take Tylenol (acetaminophen) or medicines that have acetaminophen with this medicine. Too much acetaminophen can be very dangerous. Many nonprescription medicines contain acetaminophen. Always read the labels carefully to avoid taking more acetaminophen. What side effects may I notice from receiving this medicine? Side effects that you should  report to your doctor or health care professional as soon as possible: -allergic reactions like skin rash, itching or hives, swelling of the face, lips, or tongue -breathing difficulties, wheezing -confusion -light headedness or fainting spells -severe stomach pain -yellowing of the skin or the whites of the eyes Side effects that usually do not require medical attention (report to your doctor or health care professional if they continue or are bothersome): -dizziness -drowsiness -nausea -vomiting This list may not describe all possible side effects. Call your doctor for medical advice about side effects. You may report side effects to FDA at 1-800-FDA-1088. Where should I keep my medicine? Keep out of the reach of children. This medicine can be abused. Keep your medicine in a safe place to protect it from theft. Do not share this medicine with anyone. Selling or giving away this medicine is dangerous and against the law. Store at room temperature between 20 and 25 degrees C (68 and 77 degrees F). Keep container tightly closed. Protect from light. Flush any unused medicines down the toilet. Do not use the medicine after the expiration date. NOTE: This sheet is a summary. It may not cover all possible information. If you have questions about this medicine, talk to your doctor, pharmacist, or health care provider.  2012, [employer] Standard. (02/22/2008 10:01:21 AM)

## 2012-08-03 DIAGNOSIS — J309 Allergic rhinitis, unspecified: Secondary | ICD-10-CM | POA: Insufficient documentation

## 2016-08-09 DIAGNOSIS — J302 Other seasonal allergic rhinitis: Secondary | ICD-10-CM | POA: Diagnosis not present

## 2016-08-09 DIAGNOSIS — L0100 Impetigo, unspecified: Secondary | ICD-10-CM | POA: Diagnosis not present

## 2016-08-23 DIAGNOSIS — L0100 Impetigo, unspecified: Secondary | ICD-10-CM | POA: Diagnosis not present

## 2016-08-23 DIAGNOSIS — L309 Dermatitis, unspecified: Secondary | ICD-10-CM | POA: Diagnosis not present

## 2016-09-13 ENCOUNTER — Ambulatory Visit: Payer: Self-pay | Admitting: Allergy & Immunology

## 2016-10-03 ENCOUNTER — Telehealth: Payer: Self-pay | Admitting: Emergency Medicine

## 2016-10-03 ENCOUNTER — Encounter: Payer: Self-pay | Admitting: Emergency Medicine

## 2016-10-03 NOTE — Telephone Encounter (Signed)
Pre visit complete °

## 2016-10-04 ENCOUNTER — Encounter: Payer: Self-pay | Admitting: Family Medicine

## 2016-10-04 ENCOUNTER — Ambulatory Visit (INDEPENDENT_AMBULATORY_CARE_PROVIDER_SITE_OTHER): Payer: 59 | Admitting: Family Medicine

## 2016-10-04 VITALS — BP 138/80 | HR 77 | Temp 97.9°F | Ht 67.0 in | Wt 214.0 lb

## 2016-10-04 DIAGNOSIS — E669 Obesity, unspecified: Secondary | ICD-10-CM | POA: Diagnosis not present

## 2016-10-04 DIAGNOSIS — K429 Umbilical hernia without obstruction or gangrene: Secondary | ICD-10-CM | POA: Insufficient documentation

## 2016-10-04 DIAGNOSIS — L74519 Primary focal hyperhidrosis, unspecified: Secondary | ICD-10-CM

## 2016-10-04 DIAGNOSIS — R21 Rash and other nonspecific skin eruption: Secondary | ICD-10-CM | POA: Diagnosis not present

## 2016-10-04 DIAGNOSIS — Z0000 Encounter for general adult medical examination without abnormal findings: Secondary | ICD-10-CM | POA: Diagnosis not present

## 2016-10-04 DIAGNOSIS — Z136 Encounter for screening for cardiovascular disorders: Secondary | ICD-10-CM | POA: Diagnosis not present

## 2016-10-04 DIAGNOSIS — G479 Sleep disorder, unspecified: Secondary | ICD-10-CM | POA: Diagnosis not present

## 2016-10-04 DIAGNOSIS — Z13220 Encounter for screening for lipoid disorders: Secondary | ICD-10-CM

## 2016-10-04 DIAGNOSIS — R61 Generalized hyperhidrosis: Secondary | ICD-10-CM | POA: Insufficient documentation

## 2016-10-04 DIAGNOSIS — L304 Erythema intertrigo: Secondary | ICD-10-CM | POA: Insufficient documentation

## 2016-10-04 HISTORY — DX: Generalized hyperhidrosis: R61

## 2016-10-04 HISTORY — DX: Umbilical hernia without obstruction or gangrene: K42.9

## 2016-10-04 MED ORDER — OXYBUTYNIN CHLORIDE 5 MG PO TABS: 2.5000 mg | ORAL_TABLET | Freq: Every day | ORAL | 0 refills | Status: DC

## 2016-10-04 MED ORDER — MUPIROCIN 2 % EX OINT: 1.0000 "application " | TOPICAL_OINTMENT | Freq: Two times a day (BID) | CUTANEOUS | 0 refills | Status: DC

## 2016-10-04 MED ORDER — NYSTATIN-TRIAMCINOLONE 100000-0.1 UNIT/GM-% EX OINT: 1.0000 "application " | TOPICAL_OINTMENT | Freq: Two times a day (BID) | CUTANEOUS | 0 refills | Status: DC

## 2016-10-04 NOTE — Progress Notes (Signed)
Xavier Oconnor is a 36 y.o. male is here to Xavier Oconnor.   Patient Care Team: Xavier Rima, DO as PCP - General (Family Medicine)   History of Present Illness:   Xavier Oconnor, CMA, acting as scribe for Dr. Earlene Oconnor.  HPI: Patient states he would like to establish care today.  He is slightly concerned about his weight.  States that he is "not tall enough to weigh that much".  Also states he has excessive sweating.  He will begin to sweat with very little exertion.  No complaints otherwise.  No medication allergies.  Health Maintenance Due  Topic Date Due  . HIV Screening  07/27/1993  . TETANUS/TDAP  07/27/1997   PMHx, SurgHx, SocialHx, Medications, and Allergies were reviewed in the Visit Navigator and updated as appropriate.   Past Medical History:  Diagnosis Date  . Allergy    History reviewed. No pertinent surgical history.  Family History  Problem Relation Age of Onset  . Cancer Sister    Social History  Substance Use Topics  . Smoking status: Never Smoker  . Smokeless tobacco: Never Used  . Alcohol use 1.2 oz/week    2 Cans of beer per week    Current Medications and Allergies:   .  cetirizine (ZYRTEC) 10 MG tablet, Take by mouth., Disp: , Rfl:  .  fluticasone (FLONASE) 50 MCG/ACT nasal spray, USE 1 SPRAY IN EACH NOSTRIL EVERY DAY, Disp: , Rfl:   No Known Allergies   Review of Systems:   Review of Systems  All other systems reviewed and are negative.  Vitals:   Vitals:   10/04/16 0849  BP: 138/80  Pulse: 77  Temp: 97.9 F (36.6 C)  TempSrc: Oral  SpO2: 95%  Weight: 214 lb (97.1 kg)  Height: 5\' 7"  (1.702 m)     Body mass index is 33.52 kg/m.  Physical Exam:   Physical Exam  Constitutional: He is oriented to person, place, and time. He appears well-developed and well-nourished. No distress.  HENT:  Head: Normocephalic and atraumatic.  Right Ear: External ear normal.  Left Ear: External ear normal.  Nose: Nose normal.  Mouth/Throat:  Oropharynx is clear and moist.  Eyes: Conjunctivae and EOM are normal. Pupils are equal, round, and reactive to light.  Neck: Normal range of motion. Neck supple.  Cardiovascular: Normal rate, regular rhythm, normal heart sounds and intact distal pulses.   Pulmonary/Chest: Effort normal and breath sounds normal.  Abdominal: Soft. Bowel sounds are normal. There is no tenderness.  Musculoskeletal: Normal range of motion.  Neurological: He is alert and oriented to person, place, and time.  Skin: Skin is warm and dry.     Psychiatric: He has a normal mood and affect. His behavior is normal. Judgment and thought content normal.  Nursing note and vitals reviewed.  Assessment and Plan:   Xavier Oconnor was seen today for establish care.  Diagnoses and all orders for this visit:  Routine health maintenance -     CBC with Differential/Platelet; Future -     Comprehensive metabolic panel; Future -     TSH; Future  Obesity (BMI 30-39.9) Comments: The patient is asked to make an attempt to improve diet and exercise patterns to aid in medical management of this problem.   Disordered sleep -     Home sleep test; Future  Rash, skin -     mupirocin ointment (BACTROBAN) 2 %; Apply 1 application topically 2 (two) times daily. For leg rash  Intertrigo -  nystatin-triamcinolone ointment (MYCOLOG); Apply 1 application topically 2 (two) times daily. For groin rash  Hyperhidrosis -     oxybutynin (DITROPAN) 5 MG tablet; Take 0.5 tablets (2.5 mg total) by mouth daily.  Umbilical hernia without obstruction and without gangrene -     Ambulatory referral to General Surgery  Encounter for lipid screening for cardiovascular disease -     Lipid panel; Future   . Reviewed expectations re: course of current medical issues. . Discussed self-management of symptoms. . Outlined signs and symptoms indicating need for more acute intervention. . Patient verbalized understanding and all questions were  answered. Marland Kitchen. Health Maintenance issues including appropriate healthy diet, exercise, and smoking avoidance were discussed with patient. . See orders for this visit as documented in the electronic medical record. . Patient received an After Visit Summary.  CMA served as Neurosurgeonscribe during this visit. History, Physical, and Plan performed by medical provider. The above documentation has been reviewed and is accurate and complete. Xavier RimaErica Carrie Oconnor, D.O.  Xavier RimaErica Xavier Poth, DO [place] [place] [facility] 10/04/2016  Future Appointments Date Time Provider Department Center  10/17/2016 3:30 PM Xavier Oconnor, Xavier R, DPM [facility]    Patient Counseling: [x]   Nutrition: Stressed importance of moderation in sodium/caffeine intake, saturated fat and cholesterol, caloric balance, sufficient intake of fresh fruits, vegetables, and fiber.  [x]   Stressed the importance of regular exercise.   [x]   Substance Abuse: Discussed cessation/primary prevention of tobacco, alcohol, or other drug use; driving or other dangerous activities under the influence; availability of treatment for abuse.   [x]   Injury prevention: Discussed safety belts, safety helmets, smoke detector, smoking near bedding or upholstery.   [x]   Sexuality: Discussed sexually transmitted diseases, partner selection, use of condoms, avoidance of unintended pregnancy and contraceptive alternatives.   [x]   Dental health: Discussed importance of regular tooth brushing, flossing, and dental visits.  [x]   Health maintenance and immunizations reviewed. Please refer to Health maintenance section.

## 2016-10-04 NOTE — Patient Instructions (Signed)
Soda Facts  What's in a typical 20 oz bottle of soda?  1. Serving Size: A 20 oz soda bottle has 2.5 servings. It's important to look at the Nutrition Facts (right) for the package, not just a single serving, if drinking the entire bottle. A standard serving size is 8 oz.  2. Calories: There are 100 calories in one serving and 250 in the bottle. These calories have no nutritional value.  3. Sugars: It's easier to figure out how much sugar is in a drink by changing the grams to teaspoons. Divide the total sugar grams by four (4) to get an approximate number of teaspoons. This bottle contains about 17 teaspoons of sugar (694).  4. Ingredients: In this beverage, the main ingredient after water is high fructose corn syrup. Other common names for sugars are cane syrup, sucrose, and dextrose.  Sugary Drinks and Your Health  Drinking a 20-ounce (oz) bottle of soda every day can lead to about 25 extra pounds of weight gain a year.   To burn off the additional 250 calories in a 20 oz bottle of soda, you would have to walk briskly for approximately 60 minutes. Choosing healthier drinks is an essential step in maintaining a healthy weight and helping to reverse the obesity epidemic. 

## 2016-10-07 ENCOUNTER — Other Ambulatory Visit: Payer: Self-pay

## 2016-10-07 ENCOUNTER — Telehealth: Payer: Self-pay | Admitting: Family Medicine

## 2016-10-07 DIAGNOSIS — L304 Erythema intertrigo: Secondary | ICD-10-CM

## 2016-10-07 MED ORDER — NYSTATIN-TRIAMCINOLONE 100000-0.1 UNIT/GM-% EX OINT: 1.0000 "application " | TOPICAL_OINTMENT | Freq: Two times a day (BID) | CUTANEOUS | 1 refills | Status: DC

## 2016-10-07 MED ORDER — TRIAMCINOLONE ACETONIDE 0.5 % EX OINT: 1.0000 "application " | TOPICAL_OINTMENT | Freq: Two times a day (BID) | CUTANEOUS | 2 refills | Status: DC

## 2016-10-07 MED ORDER — NYSTATIN 100000 UNIT/GM EX OINT: 1.0000 "application " | TOPICAL_OINTMENT | Freq: Two times a day (BID) | CUTANEOUS | 2 refills | Status: DC

## 2016-10-07 NOTE — Telephone Encounter (Signed)
[facility] called and states that Patients insurance will not cover triamcinolone/nystatin comb. It will cover it separately. Please send separate RX's.

## 2016-10-07 NOTE — Telephone Encounter (Signed)
Done

## 2016-10-17 ENCOUNTER — Ambulatory Visit (INDEPENDENT_AMBULATORY_CARE_PROVIDER_SITE_OTHER): Payer: 59

## 2016-10-17 ENCOUNTER — Encounter: Payer: Self-pay | Admitting: Podiatry

## 2016-10-17 ENCOUNTER — Ambulatory Visit (INDEPENDENT_AMBULATORY_CARE_PROVIDER_SITE_OTHER): Payer: 59 | Admitting: Podiatry

## 2016-10-17 DIAGNOSIS — M25571 Pain in right ankle and joints of right foot: Secondary | ICD-10-CM

## 2016-10-17 DIAGNOSIS — M722 Plantar fascial fibromatosis: Secondary | ICD-10-CM

## 2016-10-17 DIAGNOSIS — M79672 Pain in left foot: Secondary | ICD-10-CM

## 2016-10-17 MED ORDER — MELOXICAM 15 MG PO TABS: 15.0000 mg | ORAL_TABLET | Freq: Every day | ORAL | 2 refills | Status: DC

## 2016-10-17 NOTE — Progress Notes (Signed)
Subjective:    Patient ID: Xavier Oconnor, male    DOB: May 13, 1980, 36 y.o.   MRN: [id number]  HPI  36 year old male presents the also concerns of pain to the bottom of the left foot which is been ongoing for about 4 months or so. He is unsure exactly home on it started. He states the pain is intermittent in nature. He states there is a sharp pain he points the bottom of the heel where he has pain. Occasionally will go to the arch of the foot as well. Pain does not wake him up at night. There is no swelling that he has noticed. No specific injury to left side. He also has secondary concerns of right ankle pain that he twisted about 2 months ago. Since he said some mild discomfort top of the foot which has been slowly improving. He states that wise here to get in the left foot checked he might as well but the right foot check as well. He has no other concerns.  Review of Systems  All other systems reviewed and are negative.      Objective:   Physical Exam General: AAO x3, NAD  Dermatological: Skin is warm, dry and supple bilateral. Nails x 10 are well manicured; remaining integument appears unremarkable at this time. There are no open sores, no preulcerative lesions, no rash or signs of infection present.  Vascular: Dorsalis Pedis artery and Posterior Tibial artery pedal pulses are 2/4 bilateral with immedate capillary fill time. Pedal hair growth present. No varicosities and no lower extremity edema present bilateral. There is no pain with calf compression, swelling, warmth, erythema.   Neruologic: Grossly intact via light touch bilateral. Protective threshold with Semmes Wienstein monofilament intact to all pedal sites bilateral. Negative tinel sign bilaterally.   Musculoskeletal: Tenderness to palpation along the plantar medial tubercle of the calcaneus at the insertion of plantar fascia on the left on the right side there is minimal discomfort to the dorsal aspect of the foot just distal  to the ankle joint this appears been on the talonavicular joint. There is no overlying edema, erythema, increase in warmth. There is no pain vibratory sensation and tenderness is minimal. There is no overlying edema. There is no erythema or increase in warmth.. There is no pain along the course of the plantar fascia within the arch of the foot. Plantar fascia appears to be intact. There is no pain with lateral compression of the calcaneus or pain with vibratory sensation. There is no pain along the course or insertion of the achilles tendon. No other areas of tenderness to bilateral lower extremities. Muscular strength 5/5 in all groups tested bilateral.  Gait: Unassisted, Nonantalgic.      Assessment & Plan:  36 year old male left heel pain likely result of plantar fasciitis; sprain right foot/ankle -Treatment options discussed including all alternatives, risks, and complications -Etiology of symptoms were discussed -X-rays were obtained and reviewed with the patient. No evidence of acute fracture identified today.  1. Left foot pain; likely plantar fasciitis -Prescribed mobic. Discussed side effects of the medication and directed to stop if any are to occur and call the office.  -Patient elects to proceed with steroid injection into the left heel. Under sterile skin preparation, a total of 2.5cc of kenalog 10, 0.5% Marcaine plain, and 2% lidocaine plain were infiltrated into the symptomatic area without complication. A band-aid was applied. Patient tolerated the injection well without complication. Post-injection care with discussed with the patient. Discussed with  the patient to ice the area over the next couple of days to help prevent a steroid flare.  -Plantar fascial brace -Stretching, icing exercises daily -Discussed shoe gear modifications and orthotics  2. Right foot sprain -Prescribed mobic. Discussed side effects of the medication and directed to stop if any are to occur and call the  office.  -Ice  RTC 3 weeks or sooner if needed.   Ovid CurdMatthew Wagoner, DPM

## 2016-10-17 NOTE — Patient Instructions (Signed)

## 2016-10-18 ENCOUNTER — Other Ambulatory Visit (INDEPENDENT_AMBULATORY_CARE_PROVIDER_SITE_OTHER): Payer: 59

## 2016-10-18 ENCOUNTER — Telehealth: Payer: Self-pay

## 2016-10-18 DIAGNOSIS — Z136 Encounter for screening for cardiovascular disorders: Secondary | ICD-10-CM

## 2016-10-18 DIAGNOSIS — Z13220 Encounter for screening for lipoid disorders: Secondary | ICD-10-CM | POA: Diagnosis not present

## 2016-10-18 DIAGNOSIS — Z0000 Encounter for general adult medical examination without abnormal findings: Secondary | ICD-10-CM

## 2016-10-18 DIAGNOSIS — Z113 Encounter for screening for infections with a predominantly sexual mode of transmission: Secondary | ICD-10-CM

## 2016-10-18 LAB — CBC WITH DIFFERENTIAL/PLATELET
Basophils Absolute: 0 10*3/uL (ref 0.0–0.1)
Basophils Relative: 0.6 % (ref 0.0–3.0)
Eosinophils Absolute: 0.2 10*3/uL (ref 0.0–0.7)
Eosinophils Relative: 4.8 % (ref 0.0–5.0)
HCT: 43.3 % (ref 39.0–52.0)
Hemoglobin: 14.8 g/dL (ref 13.0–17.0)
Lymphocytes Relative: 39.3 % (ref 12.0–46.0)
Lymphs Abs: 1.7 10*3/uL (ref 0.7–4.0)
MCHC: 34.2 g/dL (ref 30.0–36.0)
MCV: 87.7 fl (ref 78.0–100.0)
Monocytes Absolute: 0.7 10*3/uL (ref 0.1–1.0)
Monocytes Relative: 16.7 % — ABNORMAL HIGH (ref 3.0–12.0)
Neutro Abs: 1.7 10*3/uL (ref 1.4–7.7)
Neutrophils Relative %: 38.6 % — ABNORMAL LOW (ref 43.0–77.0)
Platelets: 261 10*3/uL (ref 150.0–400.0)
RBC: 4.94 Mil/uL (ref 4.22–5.81)
RDW: 12.9 % (ref 11.5–15.5)
WBC: 4.3 10*3/uL (ref 4.0–10.5)

## 2016-10-18 LAB — COMPREHENSIVE METABOLIC PANEL
ALT: 22 U/L (ref 0–53)
AST: 18 U/L (ref 0–37)
Albumin: 4.5 g/dL (ref 3.5–5.2)
Alkaline Phosphatase: 78 U/L (ref 39–117)
BUN: 13 mg/dL (ref 6–23)
CO2: 27 mEq/L (ref 19–32)
Calcium: 9.4 mg/dL (ref 8.4–10.5)
Chloride: 103 mEq/L (ref 96–112)
Creatinine, Ser: 1 mg/dL (ref 0.40–1.50)
GFR: 107.42 mL/min (ref >60.00)
Glucose, Bld: 72 mg/dL (ref 70–99)
Potassium: 3.9 mEq/L (ref 3.5–5.1)
Sodium: 138 mEq/L (ref 135–145)
Total Bilirubin: 0.8 mg/dL (ref 0.2–1.2)
Total Protein: 7.6 g/dL (ref 6.0–8.3)

## 2016-10-18 LAB — LIPID PANEL
Cholesterol: 188 mg/dL (ref 0–200)
HDL: 49.2 mg/dL (ref >39.00)
LDL Cholesterol: 114 mg/dL — ABNORMAL HIGH (ref 0–99)
NonHDL: 138.83
Total CHOL/HDL Ratio: 4
Triglycerides: 125 mg/dL (ref 0.0–149.0)
VLDL: 25 mg/dL (ref 0.0–40.0)

## 2016-10-18 LAB — TSH: TSH: 0.8 u[IU]/mL (ref 0.35–4.50)

## 2016-10-18 NOTE — Telephone Encounter (Signed)
Confirmed with pt that he is not having any symptoms. He said they are just for screening. Will place future orders.  Pt said he will call and schedule a lab appointment when he would like to have the labs performed.

## 2016-10-18 NOTE — Telephone Encounter (Signed)
Pt requested to have routine STD testing performed after I had already drawn his blood.  I advised him without orders I could not send off for it today. He had no problem with that.  Told him I would let Dr. Earlene PlaterWallace know he requested testing and she could place orders for whenever he decided to make a lab appointment to have them done.

## 2016-10-18 NOTE — Telephone Encounter (Signed)
Call patient to confirm no symptoms and truly screening. Okay HIV, RPR, (dirty) urine for GC/Chlamydia and (clean) urine for trichomonas.

## 2016-10-21 ENCOUNTER — Telehealth: Payer: Self-pay | Admitting: Family Medicine

## 2016-10-21 NOTE — Telephone Encounter (Signed)
Patient was not able to get triamcinolone (kenalog) 05% ointment. Pharm faxed PA for it, pt calling to check on the status of it, or if we will prescribe something different.  Thank you,  -LL

## 2016-10-22 MED ORDER — TRIAMCINOLONE ACETONIDE 0.5 % EX OINT: 1.0000 "application " | TOPICAL_OINTMENT | Freq: Two times a day (BID) | CUTANEOUS | 2 refills | Status: DC

## 2016-10-22 NOTE — Telephone Encounter (Signed)
Spoke with pharmacy and RX does not need a PA. RX is $9.52 to pick up. They are going to fill the RX for patient. I have left message for the patient to return call.

## 2016-10-23 ENCOUNTER — Encounter: Payer: Self-pay | Admitting: Family Medicine

## 2016-10-25 NOTE — Telephone Encounter (Signed)
Patient was advised that clinical staff called the home phone first and then his number, no further steps necessary.

## 2016-10-25 NOTE — Telephone Encounter (Signed)
Patient received a call about lab results already and discussed them with Asher MuirJamie. Patient stated that another nurse called with lab results to his home phone and his mother answered, he was unsure if this was just a repeat call or if something was wrong. Please call patient to advise on mobile phone.

## 2016-10-25 NOTE — Telephone Encounter (Signed)
Called cell and left VM for pt to call the office.

## 2016-11-01 ENCOUNTER — Ambulatory Visit: Payer: Self-pay | Admitting: Surgery

## 2016-11-01 DIAGNOSIS — K429 Umbilical hernia without obstruction or gangrene: Secondary | ICD-10-CM | POA: Diagnosis not present

## 2016-11-01 NOTE — H&P (Signed)
  History of Present Illness Xavier Oconnor(Xavier Oconnor; 11/01/2016 6:13 PM) The patient is a 36 year old male who presents with an umbilical hernia. Referred by Xavier BudErika Wallace, DO for umbilical hernia  This is a 36 yo male who presents with a long history of a small protruding umbilical hernia. This has become more noticeable recently but he denies any significant pain in this area. No GI obstructive symptoms. He recently established care with Xavier Oconnor who noted this umbilical hernia. He is now referred to discuss surgical treatment.    Allergies (Xavier Oconnor, CMA; 11/01/2016 10:50 AM) No Known Drug Allergies 11/01/2016 Allergies Reconciled  Medication History (Xavier Oconnor, CMA; 11/01/2016 10:51 AM) Levocetirizine Dihydrochloride (5MG  Tablet, Oral) Active. Triamcinolone Acetonide (0.5% Ointment, External) Active. Mupirocin (2% Ointment, External) Active. Nystatin (100000 UNIT/GM Cream, External) Active. Medications Reconciled    Vitals (Xavier Oconnor CMA; 11/01/2016 10:50 AM) 11/01/2016 10:50 AM Weight: 208.4 lb Height: 67in Body Surface Area: 2.06 m Body Mass Index: 32.64 kg/m  Temp.: 98.77F  Pulse: 72 (Regular)  BP: 124/76 (Sitting, Left Arm, Standard)      Physical Exam Xavier Oconnor(Xavier Oconnor; 11/01/2016 6:14 PM)  The physical exam findings are as follows: Note:WDWN in NAD Eyes: Pupils equal, round; sclera anicteric HENT: Oral mucosa moist; good dentition Neck: No masses palpated, no thyromegaly Lungs: CTA bilaterally; normal respiratory effort CV: Regular rate and rhythm; no murmurs; extremities well-perfused with no edema Abd: +bowel sounds, soft, non-tender, no palpable organomegaly; protruding umbilical hernia at upper edge of umbilicus; enlarges with Valsalva maneuver. There seems to be two separate bulges, but I can only palpate a single 2 cm defect. Skin: Warm, dry; no sign of jaundice Psychiatric - alert and oriented x 4; calm mood and  affect    Assessment & Plan Xavier Oconnor(Xavier Oconnor; 11/01/2016 6:14 PM)  UMBILICAL HERNIA WITHOUT OBSTRUCTION OR GANGRENE (K42.9)  Current Plans Schedule for Surgery - Umbilical hernia repair with mesh. The surgical procedure has been discussed with the patient. Potential risks, benefits, alternative treatments, and expected outcomes have been explained. All of the patient's questions at this time have been answered. The likelihood of reaching the patient's treatment goal is good. The patient understand the proposed surgical procedure and wishes to proceed. Note:The hernia seems to be enlarging, so I have recommended elective repair with mesh. The patient is in good health and is an excellent candidate for surgery.  Xavier ArmsMatthew K. Corliss Skainssuei, Oconnor, [facility]  General/ Trauma Surgery  11/01/2016 6:15 PM

## 2016-11-08 ENCOUNTER — Ambulatory Visit: Payer: 59 | Admitting: Podiatry

## 2016-11-17 DIAGNOSIS — G4733 Obstructive sleep apnea (adult) (pediatric): Secondary | ICD-10-CM | POA: Diagnosis not present

## 2016-11-19 ENCOUNTER — Other Ambulatory Visit: Payer: Self-pay | Admitting: *Deleted

## 2016-11-19 DIAGNOSIS — G4733 Obstructive sleep apnea (adult) (pediatric): Secondary | ICD-10-CM | POA: Diagnosis not present

## 2016-11-19 DIAGNOSIS — G479 Sleep disorder, unspecified: Secondary | ICD-10-CM

## 2016-11-21 ENCOUNTER — Other Ambulatory Visit: Payer: Self-pay | Admitting: Surgical

## 2016-11-21 ENCOUNTER — Telehealth: Payer: Self-pay | Admitting: Family Medicine

## 2016-11-21 DIAGNOSIS — G479 Sleep disorder, unspecified: Secondary | ICD-10-CM

## 2016-11-21 NOTE — Telephone Encounter (Signed)
Spoke with [place] and we will call to make sure that [facility] has the order.

## 2016-11-21 NOTE — Telephone Encounter (Signed)
[facility] called in reference to order being put in Boone MasterJessica Jones name instead of Dr. Philis PiqueWallace's name. Order needs to be cancelled and put in Erica's name. Please call [facility] with any questions.

## 2016-11-21 NOTE — Progress Notes (Signed)
Order placed for CPAP titration.

## 2016-11-21 NOTE — Telephone Encounter (Signed)
Patient would like documentation of his symptoms and diagnosis/prognosis for work.  He would also like a call back to discuss sleep test results.  Ty,  -LL

## 2016-11-22 NOTE — Telephone Encounter (Signed)
Left message for patient to call the office to schedule an appointment to discuss sleep study results.

## 2016-11-27 NOTE — Telephone Encounter (Signed)
Patient scheduled appointment for 11/29/16.

## 2016-11-29 ENCOUNTER — Ambulatory Visit: Payer: 59 | Admitting: Family Medicine

## 2017-01-12 ENCOUNTER — Encounter (HOSPITAL_BASED_OUTPATIENT_CLINIC_OR_DEPARTMENT_OTHER): Payer: 59

## 2017-01-21 ENCOUNTER — Ambulatory Visit (INDEPENDENT_AMBULATORY_CARE_PROVIDER_SITE_OTHER): Payer: 59 | Admitting: Family Medicine

## 2017-01-21 ENCOUNTER — Encounter: Payer: Self-pay | Admitting: Family Medicine

## 2017-01-21 VITALS — BP 100/70 | HR 76 | Ht 67.0 in | Wt 212.2 lb

## 2017-01-21 DIAGNOSIS — H109 Unspecified conjunctivitis: Secondary | ICD-10-CM

## 2017-01-21 MED ORDER — ERYTHROMYCIN 5 MG/GM OP OINT: 1.0000 "application " | TOPICAL_OINTMENT | Freq: Every day | OPHTHALMIC | 0 refills | Status: DC

## 2017-01-21 MED ORDER — OLOPATADINE HCL 0.2 % OP SOLN: OPHTHALMIC | 0 refills | Status: DC

## 2017-01-21 NOTE — Patient Instructions (Signed)
Start the drop and the ointment.  Let me know if your symptoms worsen or do not improve soon.  Take care,  Dr Jimmey Ralph

## 2017-01-21 NOTE — Progress Notes (Signed)
    Subjective:  Xavier Oconnor is a 36 y.o. male who presents today with a chief complaint of right eye swelling.   HPI:  Right Eye Swelling Started yesterday morning. Associated with some itching and watery discharge. No purulent discharge. No photophobia. No fevers. Normal vision. Discharge is worse in the morning. No treatments tried. No obvious precipitating events.   Has similar episodes frequently that usually improve with over the counter drops. He has a history of allergic rhinitis. No other URI symptoms.  ROS: Per HPI  PMH: Smoking history reviewed. Never smoker.   Objective:  Physical Exam: BP 100/70   Pulse 76   Ht  (1.702 m)   Wt 212 lb 3.2 oz (96.3 kg)   SpO2 96%   BMI 33.24 kg/m   Gen: NAD, resting comfortably HEENT: Right eye with injected sclera and watery discharge. ROMI without pain. PERRL. Mild abrasions noted along temporal aspect of upper and lower eyelids. Mild edema of eyelids noted.  Assessment/Plan:  Conjunctivitis Likely allergic vs viral. No signs of preseptal or orbital cellulitis. Start pataday drops. Given his abrasions, will also start topical erythromycin ointment to prevent secondary infection. Strict return precautions reviewed. Follow up as needed.   Katina Degree. Jimmey Ralph, MD 01/21/2017 12:02 PM

## 2017-06-03 ENCOUNTER — Ambulatory Visit: Payer: 59 | Admitting: Physician Assistant

## 2017-06-03 ENCOUNTER — Ambulatory Visit: Payer: 59 | Admitting: Family Medicine

## 2017-06-03 ENCOUNTER — Encounter: Payer: Self-pay | Admitting: Physician Assistant

## 2017-06-03 VITALS — BP 116/78 | HR 67 | Temp 98.2°F | Ht 67.0 in | Wt 222.0 lb

## 2017-06-03 DIAGNOSIS — H029 Unspecified disorder of eyelid: Secondary | ICD-10-CM | POA: Diagnosis not present

## 2017-06-03 MED ORDER — VALACYCLOVIR HCL 1 G PO TABS: 1000.0000 mg | ORAL_TABLET | Freq: Two times a day (BID) | ORAL | 0 refills | Status: DC

## 2017-06-03 MED ORDER — DOXYCYCLINE HYCLATE 100 MG PO TABS: 100.0000 mg | ORAL_TABLET | Freq: Two times a day (BID) | ORAL | 0 refills | Status: DC

## 2017-06-03 MED ORDER — IBUPROFEN 600 MG PO TABS: 600.0000 mg | ORAL_TABLET | Freq: Three times a day (TID) | ORAL | 0 refills | Status: DC | PRN

## 2017-06-03 MED ORDER — MUPIROCIN 2 % EX OINT: TOPICAL_OINTMENT | CUTANEOUS | 0 refills | Status: DC

## 2017-06-03 NOTE — Patient Instructions (Signed)
It was great to meet you!  Start both medications today.  We will call you with your culture results.  Contact a doctor if:  You have a fever.  Your symptoms do not get better after 10 days. Get help right away if:  You have a fever and your symptoms suddenly get worse.  You have very bad pain when you move your eye.  Your face: ? Hurts. ? Is red. ? Is swollen.  You have sudden loss of vision. This information is not intended to replace advice given to you by your health care provider. Make sure you discuss any questions you have with your health care provider.

## 2017-06-03 NOTE — Progress Notes (Signed)
Xavier Oconnor is a 37 y.o. male here for a new problem.  I acted as a Neurosurgeonscribe for Energy East CorporationSamantha Kalley Nicholl, PA-C Corky Mullonna Orphanos, LPN  History of Present Illness:   Chief Complaint  Patient presents with  . Right eye problem    Eye Problem   The right eye is affected. This is a recurrent problem. Episode onset: Started Saturday night. Episode frequency: Happens several times a year to right eye. The problem has been gradually worsening. There was no injury mechanism. The pain is at a severity of 7/10. The pain is moderate. There is no known exposure to pink eye. He does not wear contacts. Associated symptoms include eye redness and itching. Pertinent negatives include no blurred vision, eye discharge, double vision, fever or nausea. Associated symptoms comments: Under right eye, red and swollen with bumps. He has tried eye drops for the symptoms. The treatment provided no relief.   Patient has been dealing with this eye issue for quite some time. He was most recently seen by Dr. Jimmey RalphParker in Oct 2018 for this, prescribed Pataday and Erythromycin ointment. He states that this is the worst that his eye has ever looked.   Past Medical History:  Diagnosis Date  . Allergy      Social History   Socioeconomic History  . Marital status: Single    Spouse name: Not on file  . Number of children: Not on file  . Years of education: Not on file  . Highest education level: Not on file  Social Needs  . Financial resource strain: Not on file  . Food insecurity - worry: Not on file  . Food insecurity - inability: Not on file  . Transportation needs - medical: Not on file  . Transportation needs - non-medical: Not on file  Occupational History  . Not on file  Tobacco Use  . Smoking status: Never Smoker  . Smokeless tobacco: Never Used  Substance and Sexual Activity  . Alcohol use: Yes    Alcohol/week: 1.2 oz    Types: 2 Cans of beer per week  . Drug use: No  . Sexual activity: Yes    Birth  control/protection: None  Other Topics Concern  . Not on file  Social History Narrative  . Not on file    History reviewed. No pertinent surgical history.  Family History  Problem Relation Age of Onset  . Breast cancer Sister     No Known Allergies  Current Medications:   Current Outpatient Medications:  .  cetirizine (ZYRTEC) 10 MG tablet, Take by mouth., Disp: , Rfl:  .  fluticasone (FLONASE) 50 MCG/ACT nasal spray, USE 1 SPRAY IN EACH NOSTRIL EVERY DAY, Disp: , Rfl:  .  Olopatadine HCl 0.2 % SOLN, Place 1 drop into eye daily., Disp: 2.5 mL, Rfl: 0 .  doxycycline (VIBRA-TABS) 100 MG tablet, Take 1 tablet (100 mg total) by mouth 2 (two) times daily., Disp: 20 tablet, Rfl: 0 .  erythromycin ophthalmic ointment, Place 1 application into the right eye at bedtime. (Patient not taking: Reported on 06/03/2017), Disp: 3.5 g, Rfl: 0 .  ibuprofen (ADVIL,MOTRIN) 600 MG tablet, Take 1 tablet (600 mg total) by mouth every 8 (eight) hours as needed., Disp: 30 tablet, Rfl: 0 .  mupirocin ointment (BACTROBAN) 2 %, Apply small amount to area 1-2 times daily. Do not put in eye., Disp: 22 g, Rfl: 0 .  valACYclovir (VALTREX) 1000 MG tablet, Take 1 tablet (1,000 mg total) by mouth 2 (two) times  daily., Disp: 20 tablet, Rfl: 0   Review of Systems:   Review of Systems  Constitutional: Negative for fever.  Eyes: Positive for redness and itching. Negative for blurred vision, double vision and discharge.  Gastrointestinal: Negative for nausea.    Vitals:   Vitals:   06/03/17 0904  BP: 116/78  Pulse: 67  Temp: 98.2 F (36.8 C)  TempSrc: Oral  SpO2: 95%  Weight: 222 lb (100.7 kg)  Height: 5\' 7"  (1.702 m)     Body mass index is 34.77 kg/m.  Physical Exam:   Physical Exam  Constitutional: He appears well-developed. He is cooperative.  Non-toxic appearance. He does not have a sickly appearance. He does not appear ill. No distress.  HENT:  Head: Normocephalic and atraumatic.  Right Ear:  Tympanic membrane, external ear and ear canal normal. Tympanic membrane is not erythematous, not retracted and not bulging.  Left Ear: Tympanic membrane, external ear and ear canal normal. Tympanic membrane is not erythematous, not retracted and not bulging.  Nose: Nose normal. Right sinus exhibits no maxillary sinus tenderness and no frontal sinus tenderness. Left sinus exhibits no maxillary sinus tenderness and no frontal sinus tenderness.  Mouth/Throat: Uvula is midline. No posterior oropharyngeal edema or posterior oropharyngeal erythema.  Eyes: Conjunctivae, EOM and lids are normal. Pupils are equal, round, and reactive to light. Right eye exhibits no chemosis, no discharge and no exudate.  Small papules with honey-colored crusting under L eyelid; erythematous and swollen R lower lid  Neck: Trachea normal.  Cardiovascular: Normal rate, regular rhythm, S1 normal, S2 normal and normal heart sounds.  Pulmonary/Chest: Effort normal and breath sounds normal. He has no decreased breath sounds. He has no wheezes. He has no rhonchi. He has no rales.  Lymphadenopathy:    He has no cervical adenopathy.  Neurological: He is alert.  Skin: Skin is warm, dry and intact.  Psychiatric: He has a normal mood and affect. His speech is normal and behavior is normal.  Nursing note and vitals reviewed.      Culture obtained from R eyelid  Assessment and Plan:    Ko was seen today for right eye problem.  Diagnoses and all orders for this visit:  Lesion of right eyelid R eyelid with questionable vesicular lesions vs impetigo -- briefly examined by Dr. Helane Rima. Culture obtained. Will provide double coverage with doxycycline and valtrex. Bactroban ointment to be applied to area, do not put in eye. I recommended that he follow-up with [place] on Friday for [place] to check his eye, sooner if needed. ED precautions discussed. Take ibuprofen prn for inflammation and pain.  -     Wound culture  Other  orders -     doxycycline (VIBRA-TABS) 100 MG tablet; Take 1 tablet (100 mg total) by mouth 2 (two) times daily. -     valACYclovir (VALTREX) 1000 MG tablet; Take 1 tablet (1,000 mg total) by mouth 2 (two) times daily. -     ibuprofen (ADVIL,MOTRIN) 600 MG tablet; Take 1 tablet (600 mg total) by mouth every 8 (eight) hours as needed. -     mupirocin ointment (BACTROBAN) 2 %; Apply small amount to area 1-2 times daily. Do not put in eye.   . Reviewed expectations re: course of current medical issues. . Discussed self-management of symptoms. . Outlined signs and symptoms indicating need for more acute intervention. . Patient verbalized understanding and all questions were answered. . See orders for this visit as documented in the electronic medical  record. . Patient received an After-Visit Summary.  CMA or LPN served as scribe during this visit. History, Physical, and Plan performed by medical provider. Documentation and orders reviewed and attested to.  Jarold Motto, PA-C

## 2017-06-05 LAB — HERPES SIMPLEX VIRUS CULTURE
MICRO NUMBER:: 90250640
SPECIMEN QUALITY:: ADEQUATE

## 2017-06-06 ENCOUNTER — Encounter: Payer: Self-pay | Admitting: Physician Assistant

## 2017-06-06 ENCOUNTER — Other Ambulatory Visit (HOSPITAL_COMMUNITY)
Admission: RE | Admit: 2017-06-06 | Discharge: 2017-06-06 | Disposition: A | Discharge status: Home / Self Care | Payer: 59 | Source: Ambulatory Visit | Attending: Physician Assistant | Admitting: Physician Assistant

## 2017-06-06 ENCOUNTER — Ambulatory Visit: Payer: 59 | Admitting: Physician Assistant

## 2017-06-06 VITALS — BP 108/82 | HR 67 | Temp 98.3°F | Ht 67.0 in | Wt 224.2 lb

## 2017-06-06 DIAGNOSIS — Z113 Encounter for screening for infections with a predominantly sexual mode of transmission: Secondary | ICD-10-CM | POA: Diagnosis present

## 2017-06-06 DIAGNOSIS — B0059 Other herpesviral disease of eye: Secondary | ICD-10-CM | POA: Diagnosis not present

## 2017-06-06 LAB — WOUND CULTURE
MICRO NUMBER:: 90250578
SPECIMEN QUALITY:: ADEQUATE

## 2017-06-06 MED ORDER — VALACYCLOVIR HCL 1 G PO TABS: ORAL_TABLET | ORAL | 2 refills | Status: DC

## 2017-06-06 NOTE — Patient Instructions (Addendum)
Stop doxycycline.  Complete your Valtrex prescription that you have already started.  For future outbreaks --> as soon as you develop symptoms, take 1 tablet of valtrex and then take another tablet 12 hours later. If this does not improve symptoms, let us know.  For any ingrown hairs from shaving, you may use the mupirocin ointment  We will call you with your lab results.

## 2017-06-06 NOTE — Progress Notes (Signed)
Xavier Oconnor is a 37 y.o. male here for a follow up of a pre-existing problem.  History of Present Illness:   Chief Complaint  Patient presents with  . Follow-up    HPI   Patient presents for follow-up for his eye. He was seen by me on 06/03/17 and we performed a culture of his eye lesion (see 06/03/17 note with image for more details.)  His culture came back positive for HSV. I informed him of his lab results today in office. He has been taking both the doxycycline and valtrex as prescribed. He reports that he has had improvement of his symptoms since he has last seen me. Denies eye pain, discharge from eye, fever/chills.  Of note, he did have orders to have STI testing in June but they were never completed. He is agreeable to having this done today.   He does not have an established eye doctor.   Past Medical History:  Diagnosis Date  . Allergy      Social History   Socioeconomic History  . Marital status: Single    Spouse name: Not on file  . Number of children: Not on file  . Years of education: Not on file  . Highest education level: Not on file  Social Needs  . Financial resource strain: Not on file  . Food insecurity - worry: Not on file  . Food insecurity - inability: Not on file  . Transportation needs - medical: Not on file  . Transportation needs - non-medical: Not on file  Occupational History  . Not on file  Tobacco Use  . Smoking status: Never Smoker  . Smokeless tobacco: Never Used  Substance and Sexual Activity  . Alcohol use: Yes    Alcohol/week: 1.2 oz    Types: 2 Cans of beer per week  . Drug use: No  . Sexual activity: Yes    Birth control/protection: None  Other Topics Concern  . Not on file  Social History Narrative  . Not on file    History reviewed. No pertinent surgical history.  Family History  Problem Relation Age of Onset  . Breast cancer Sister     No Known Allergies  Current Medications:   Current Outpatient Medications:   .  cetirizine (ZYRTEC) 10 MG tablet, Take by mouth., Disp: , Rfl:  .  doxycycline (VIBRA-TABS) 100 MG tablet, Take 1 tablet (100 mg total) by mouth 2 (two) times daily., Disp: 20 tablet, Rfl: 0 .  fluticasone (FLONASE) 50 MCG/ACT nasal spray, USE 1 SPRAY IN EACH NOSTRIL EVERY DAY, Disp: , Rfl:  .  ibuprofen (ADVIL,MOTRIN) 600 MG tablet, Take 1 tablet (600 mg total) by mouth every 8 (eight) hours as needed., Disp: 30 tablet, Rfl: 0 .  mupirocin ointment (BACTROBAN) 2 %, Apply small amount to area 1-2 times daily. Do not put in eye., Disp: 22 g, Rfl: 0 .  Olopatadine HCl 0.2 % SOLN, Place 1 drop into eye daily., Disp: 2.5 mL, Rfl: 0 .  valACYclovir (VALTREX) 1000 MG tablet, Take 1 tablet (1,000 mg total) by mouth 2 (two) times daily., Disp: 20 tablet, Rfl: 0 .  erythromycin ophthalmic ointment, Place 1 application into the right eye at bedtime. (Patient not taking: Reported on 06/03/2017), Disp: 3.5 g, Rfl: 0 .  valACYclovir (VALTREX) 1000 MG tablet, Take two tablets ( total 2000 mg) by mouth q12h x 1 day; Start: ASAP after symptom onset, Disp: 6 tablet, Rfl: 2   Review of Systems:  ROS  Negative unless otherwise specified per HPI.   Vitals:   Vitals:   06/06/17 0818  BP: 108/82  Pulse: 67  Temp: 98.3 F (36.8 C)  TempSrc: Oral  SpO2: 95%  Weight: 224 lb 3.2 oz (101.7 kg)  Height: 5\' 7"  (1.702 m)     Body mass index is 35.11 kg/m.  Physical Exam:   Physical Exam  Constitutional: He appears well-developed. He is cooperative.  Non-toxic appearance. He does not have a sickly appearance. He does not appear ill. No distress.  Eyes: Conjunctivae, EOM and lids are normal. Pupils are equal, round, and reactive to light.  L lower eye lid with improved swelling. No erythema. No tenderness to palpation. Small amount of flesh-colored crusting.  Cardiovascular: Normal rate, regular rhythm, S1 normal, S2 normal, normal heart sounds and normal pulses.  No LE edema  Pulmonary/Chest: Effort  normal and breath sounds normal.  Neurological: He is alert. GCS eye subscore is 4. GCS verbal subscore is 5. GCS motor subscore is 6.  Skin: Skin is warm, dry and intact.  Psychiatric: He has a normal mood and affect. His speech is normal and behavior is normal.  Nursing note and vitals reviewed.   Assessment and Plan:    [name] was seen today for follow-up.  Diagnoses and all orders for this visit:  HSV (herpes simplex virus) infection of eyelid We reviewed his culture results today. I recommend that he establish with an eye doctor to have evaluation for further management, and in case future outbreaks involve his eye we can appropriately send him to be evaluated. Continue current valtrex course. I have given him additional valtrex to take prn for future outbreaks.  Screening examination for STD (sexually transmitted disease) He is agreeable to testing today and would like this performed. We will notify him of his results when they are available. -     HIV antibody -     RPR -     Urine cytology ancillary only  Other orders -     valACYclovir (VALTREX) 1000 MG tablet; Take two tablets ( total 2000 mg) by mouth q12h x 1 day; Start: ASAP after symptom onset   . Reviewed expectations re: course of current medical issues. . Discussed self-management of symptoms. . Outlined signs and symptoms indicating need for more acute intervention. . Patient verbalized understanding and all questions were answered. . See orders for this visit as documented in the electronic medical record. . Patient received an After-Visit Summary.  Jarold MottoSamantha Eitan Doubleday, PA-C

## 2017-06-09 LAB — RPR: RPR Ser Ql: NONREACTIVE

## 2017-06-09 LAB — URINE CYTOLOGY ANCILLARY ONLY
Chlamydia: NEGATIVE
Neisseria Gonorrhea: NEGATIVE
Trichomonas: NEGATIVE

## 2017-06-09 LAB — HIV ANTIBODY (ROUTINE TESTING W REFLEX): HIV 1&2 Ab, 4th Generation: NONREACTIVE

## 2017-06-27 ENCOUNTER — Ambulatory Visit: Payer: 59 | Admitting: Family Medicine

## 2017-06-27 ENCOUNTER — Encounter: Payer: Self-pay | Admitting: Family Medicine

## 2017-06-27 ENCOUNTER — Encounter: Payer: Self-pay | Admitting: Internal Medicine

## 2017-06-27 VITALS — BP 110/80 | HR 62 | Temp 98.6°F | Wt 222.6 lb

## 2017-06-27 DIAGNOSIS — B0059 Other herpesviral disease of eye: Secondary | ICD-10-CM | POA: Diagnosis not present

## 2017-06-27 DIAGNOSIS — K625 Hemorrhage of anus and rectum: Secondary | ICD-10-CM

## 2017-06-27 DIAGNOSIS — Z23 Encounter for immunization: Secondary | ICD-10-CM

## 2017-06-27 MED ORDER — STARCH 51 % RE SUPP: 1.0000 | RECTAL | 0 refills | Status: DC | PRN

## 2017-06-27 NOTE — Progress Notes (Signed)
Xavier Oconnor is a 37 y.o. male is here for follow up.  History of Present Illness:   Barnie MortJoEllen Thompson, CMA acting as scribe for Dr. Helane RimaErica Ziana Heyliger.   HPI: Patient in office today for follow up on right eye. Was seen in the past. Eye is still itchy and if he states if touches a lot it will start swelling and have discharge. He states if he cleans with alcohol and keeps hands off of it does not bother him. He notices some floaters in his vision. Wears safety glasses at work.   He also has questions about hemorrhoids. On Sunday when he had bowel movement when he wiped and their was blood on the toilet paper had bright red blood and their was blood in the toilet not on the stool. Patient denies any itching or discomfort in rectal area. No changes in bowel movements.    Health Maintenance Due  Topic Date Due  . TETANUS/TDAP  07/27/1997   Depression screen PHQ 2/9 10/04/2016  Decreased Interest 0  Down, Depressed, Hopeless 0  PHQ - 2 Score 0   PMHx, SurgHx, SocialHx, FamHx, Medications, and Allergies were reviewed in the Visit Navigator and updated as appropriate.   Patient Active Problem List   Diagnosis Date Noted  . HSV (herpes simplex virus) infection of eyelid 06/06/2017  . Obesity (BMI 30-39.9) 10/04/2016  . Disordered sleep 10/04/2016  . Rash, skin 10/04/2016  . Intertrigo 10/04/2016  . Hyperhidrosis 10/04/2016  . Umbilical hernia 10/04/2016  . Allergic rhinitis 08/03/2012   Social History   Tobacco Use  . Smoking status: Never Smoker  . Smokeless tobacco: Never Used  Substance Use Topics  . Alcohol use: Yes    Alcohol/week: 1.2 oz    Types: 2 Cans of beer per week  . Drug use: No   Current Medications and Allergies:   .  cetirizine (ZYRTEC) 10 MG tablet, Take by mouth., Disp: , Rfl:  .  fluticasone (FLONASE) 50 MCG/ACT nasal spray, USE 1 SPRAY IN EACH NOSTRIL EVERY DAY, Disp: , Rfl:  .  Olopatadine HCl 0.2 % SOLN, Place 1 drop into eye daily., Disp: 2.5 mL, Rfl:  0  No Known Allergies   Review of Systems   Pertinent items are noted in the HPI. Otherwise, ROS is negative.  Vitals:   Vitals:   06/27/17 0829  BP: 110/80  Pulse: 62  Temp: 98.6 F (37 C)  TempSrc: Oral  SpO2: 96%  Weight: 222 lb 9.6 oz (101 kg)     Body mass index is 34.86 kg/m.  Physical Exam:   Physical Exam  Constitutional: He is oriented to person, place, and time. He appears well-developed and well-nourished. No distress.  HENT:  Head: Normocephalic and atraumatic.  Right Ear: External ear normal.  Left Ear: External ear normal.  Nose: Nose normal.  Mouth/Throat: Oropharynx is clear and moist.  Eyes: Pupils are equal, round, and reactive to light. Conjunctivae and EOM are normal.  Neck: Normal range of motion. Neck supple.  Cardiovascular: Normal rate, regular rhythm, normal heart sounds and intact distal pulses.  Pulmonary/Chest: Effort normal and breath sounds normal.  Abdominal: Soft. Bowel sounds are normal.  Genitourinary: Rectal exam shows internal hemorrhoid.  Musculoskeletal: Normal range of motion.  Neurological: He is alert and oriented to person, place, and time.  Skin: Skin is warm and dry.  Psychiatric: He has a normal mood and affect. His behavior is normal. Judgment and thought content normal.  Nursing note and  vitals reviewed.   Assessment and Plan:   1. HSV (herpes simplex virus) infection of eyelid Concern for ongoing symptoms. Previously uncontrolled with daily Valtrex. Will send to EYE to more thoroughly evaluate. Consider ID consult as well.  - Ambulatory referral to Ophthalmology  2. BRBPR (bright red blood per rectum) - Ambulatory referral to Gastroenterology - starch (ANUSOL) 51 % suppository; Place 1 suppository rectally as needed for pain.  Dispense: 24 suppository; Refill: 0  3. Need for Tdap vaccination - Tdap vaccine greater than or equal to 7yo IM   . Reviewed expectations re: course of current medical  issues. . Discussed self-management of symptoms. . Outlined signs and symptoms indicating need for more acute intervention. . Patient verbalized understanding and all questions were answered. Marland Kitchen Health Maintenance issues including appropriate healthy diet, exercise, and smoking avoidance were discussed with patient. . See orders for this visit as documented in the electronic medical record. . Patient received an After Visit Summary.  CMA served as Neurosurgeon during this visit. History, Physical, and Plan performed by medical provider. The above documentation has been reviewed and is accurate and complete. Helane Rima, D.O.  Helane Rima, DO [place] 06/27/2017

## 2017-07-18 DIAGNOSIS — H43811 Vitreous degeneration, right eye: Secondary | ICD-10-CM | POA: Diagnosis not present

## 2017-07-18 DIAGNOSIS — L0100 Impetigo, unspecified: Secondary | ICD-10-CM | POA: Diagnosis not present

## 2017-08-15 ENCOUNTER — Ambulatory Visit: Payer: 59 | Admitting: Internal Medicine

## 2017-08-15 ENCOUNTER — Encounter: Payer: Self-pay | Admitting: Internal Medicine

## 2017-08-15 VITALS — BP 110/60 | HR 63 | Ht 67.0 in | Wt 220.8 lb

## 2017-08-15 DIAGNOSIS — R140 Abdominal distension (gaseous): Secondary | ICD-10-CM

## 2017-08-15 DIAGNOSIS — K625 Hemorrhage of anus and rectum: Secondary | ICD-10-CM

## 2017-08-15 MED ORDER — PEG-KCL-NACL-NASULF-NA ASC-C 140 G PO SOLR: 1.0000 | Freq: Once | ORAL | 0 refills | Status: AC

## 2017-08-15 NOTE — Patient Instructions (Signed)

## 2017-08-15 NOTE — Progress Notes (Signed)
HISTORY OF PRESENT ILLNESS:  Xavier Oconnor is a 37 y.o. male , [employer] employee, who is sent today by his primary care provider Dr. Earlene Plater regarding a chief complaint of rectal bleeding. Patient also has a complaint of abdominal bloating discomfort. First, he reports to me that he has noticed trivial rectal bleeding intermittently over the years as manifested by small amounts of bright red blood on the tissue. However, several months ago he experienced significant rectal bleeding with blood in the toilet and on the stool. There is no associated rectal or abdominal discomfort. He did see Dr. Earlene Plater 06/27/2017. I have reviewed that encounter. Bleeding had stopped at that time. Rectal examination was said to show internal hemorrhoids for which she was prescribed but has not used Anusol suppositories. GI referral made. The patient tells me that he has noticed increased bloating discomfort. This has been most prominent in the past several months. He denies constipation. Describes bowel habits is regular. Review of outside laboratories from July 2018 shows normal hemoglobin of 14.8. Normal liver function tests. The patient does tell me that he has gained a little over 20 pounds in the past 2 years. No family history of colon cancer.  REVIEW OF SYSTEMS:  All non-GI ROS negative unless otherwise stated in the history of present illness except for sinus and allergy trouble  Past Medical History:  Diagnosis Date  . Allergy     History reviewed. No pertinent surgical history.  Social History Xavier Oconnor  reports that he has never smoked. He has never used smokeless tobacco. He reports that he drinks about 1.2 oz of alcohol per week. He reports that he does not use drugs.  family history includes Breast cancer in his sister.  No Known Allergies     PHYSICAL EXAMINATION: Vital signs: BP 110/60   Pulse 63   Ht  (1.702 m)   Wt 220 lb 12.8 oz (100.2 kg)   SpO2 96%   BMI 34.58  kg/m   Constitutional: generally well-appearing, no acute distress Psychiatric: alert and oriented x3, cooperative Eyes: extraocular movements intact, anicteric, conjunctiva pink Mouth: oral pharynx moist, no lesions Neck: supple no lymphadenopathy Cardiovascular: heart regular rate and rhythm, no murmur Lungs: clear to auscultation bilaterally Abdomen: soft, nontender, nondistended, no obvious ascites, no peritoneal signs, normal bowel sounds, no organomegaly Rectal:per Dr. Earlene Plater in March. To be repeated at the time of his colonoscopy Extremities: no clubbing, cyanosis, or lower extremity edema bilaterally Skin: no lesions on visible extremities Neuro: No focal deficits. Cranial nerves intact  ASSESSMENT:  #1. Significant isolated episode of rectal bleeding. Possibly due to identified internal hemorrhoids. Rule out colonic lesion such as neoplastic polyp #2. Abdominal bloating discomfort. Likely related to truncal obesity #3. Obesity   PLAN:  #1. Schedule colonoscopy to evaluate rectal bleeding.The nature of the procedure, as well as the risks, benefits, and alternatives were carefully and thoroughly reviewed with the patient. Ample time for discussion and questions allowed. The patient understood, was satisfied, and agreed to proceed. #2. Exercise and weight loss  A copy of this consultation note has been sent to Dr. Earlene Plater

## 2017-10-17 ENCOUNTER — Other Ambulatory Visit: Payer: Self-pay

## 2017-10-17 ENCOUNTER — Telehealth: Payer: Self-pay | Admitting: Internal Medicine

## 2017-10-17 ENCOUNTER — Ambulatory Visit (AMBULATORY_SURGERY_CENTER): Payer: 59 | Admitting: Internal Medicine

## 2017-10-17 ENCOUNTER — Encounter: Payer: Self-pay | Admitting: Internal Medicine

## 2017-10-17 VITALS — BP 108/69 | HR 74 | Temp 98.0°F | Resp 14 | Ht 67.0 in | Wt 220.0 lb

## 2017-10-17 DIAGNOSIS — K649 Unspecified hemorrhoids: Secondary | ICD-10-CM

## 2017-10-17 DIAGNOSIS — K625 Hemorrhage of anus and rectum: Secondary | ICD-10-CM | POA: Diagnosis present

## 2017-10-17 MED ORDER — SODIUM CHLORIDE 0.9 % IV SOLN: 500.0000 mL | Freq: Once | INTRAVENOUS | Status: DC

## 2017-10-17 NOTE — Patient Instructions (Signed)
YOU HAD AN ENDOSCOPIC PROCEDURE TODAY AT THE [facility]:   Refer to the procedure report that was given to you for any specific questions about what was found during the examination.  If the procedure report does not answer your questions, please call your gastroenterologist to clarify.  If you requested that your care partner not be given the details of your procedure findings, then the procedure report has been included in a sealed envelope for you to review at your convenience later.  YOU SHOULD EXPECT: Some feelings of bloating in the abdomen. Passage of more gas than usual.  Walking can help get rid of the air that was put into your GI tract during the procedure and reduce the bloating. If you had a lower endoscopy (such as a colonoscopy or flexible sigmoidoscopy) you may notice spotting of blood in your stool or on the toilet paper. If you underwent a bowel prep for your procedure, you may not have a normal bowel movement for a few days.  Please Note:  You might notice some irritation and congestion in your nose or some drainage.  This is from the oxygen used during your procedure.  There is no need for concern and it should clear up in a day or so.  SYMPTOMS TO REPORT IMMEDIATELY:   Following lower endoscopy (colonoscopy or flexible sigmoidoscopy):  Excessive amounts of blood in the stool  Significant tenderness or worsening of abdominal pains  Swelling of the abdomen that is new, acute  Fever of 100F or higher   For urgent or emergent issues, a gastroenterologist can be reached at any hour by calling [phone].   DIET:  We do recommend a small meal at first, but then you may proceed to your regular diet.  Drink plenty of fluids but you should avoid alcoholic beverages for 24 hours.  ACTIVITY:  You should plan to take it easy for the rest of today and you should NOT DRIVE or use heavy machinery until tomorrow (because of the sedation medicines used during the test).     FOLLOW UP: Our staff will call the number listed on your records the next business day following your procedure to check on you and address any questions or concerns that you may have regarding the information given to you following your procedure. If we do not reach you, we will leave a message.  However, if you are feeling well and you are not experiencing any problems, there is no need to return our call.  We will assume that you have returned to your regular daily activities without incident.  If any biopsies were taken you will be contacted by phone or by letter within the next 1-3 weeks.  Please call us at [phone] if you have not heard about the biopsies in 3 weeks.    SIGNATURES/CONFIDENTIALITY: You and/or your care partner have signed paperwork which will be entered into your electronic medical record.  These signatures attest to the fact that that the information above on your After Visit Summary has been reviewed and is understood.  Full responsibility of the confidentiality of this discharge information lies with you and/or your care-partner.   Thank you for allowing us to provide your healthcare today.  

## 2017-10-17 NOTE — Progress Notes (Signed)
A/ox3 pleased with MAC, report to RN 

## 2017-10-17 NOTE — Op Note (Signed)
[facility] Patient Name: Xavier Oconnor Procedure Date: 10/17/2017 3:48 PM MRN: [id number] Endoscopist: Wilhemina Bonito. Marina Goodell , MD Age: 37 Referring MD:  Date of Birth: 08-07-1980 Gender: Male Account #: [id number] Procedure:                Colonoscopy Indications:              Rectal bleeding Medicines:                Monitored Anesthesia Care Procedure:                Pre-Anesthesia Assessment:                           - Prior to the procedure, a History and Physical                            was performed, and patient medications and                            allergies were reviewed. The patient's tolerance of                            previous anesthesia was also reviewed. The risks                            and benefits of the procedure and the sedation                            options and risks were discussed with the patient.                            All questions were answered, and informed consent                            was obtained. Prior Anticoagulants: The patient has                            taken no previous anticoagulant or antiplatelet                            agents. ASA Grade Assessment: I - A normal, healthy                            patient. After reviewing the risks and benefits,                            the patient was deemed in satisfactory condition to                            undergo the procedure.                           After obtaining informed consent, the colonoscope  was passed under direct vision. Throughout the                            procedure, the patient's blood pressure, pulse, and                            oxygen saturations were monitored continuously. The                            Colonoscope was introduced through the anus and                            advanced to the the cecum, identified by                            appendiceal orifice and ileocecal valve. The                             ileocecal valve, appendiceal orifice, and rectum                            were photographed. The quality of the bowel                            preparation was excellent. The colonoscopy was                            performed without difficulty. The patient tolerated                            the procedure well. The bowel preparation used was                            SUPREP. Scope In: 4:00:54 PM Scope Out: 4:10:15 PM Scope Withdrawal Time: 0 hours 8 minutes 10 seconds  Total Procedure Duration: 0 hours 9 minutes 21 seconds  Findings:                 Internal hemorrhoids were found during                            retroflexion. The hemorrhoids were small.                           The exam was otherwise without abnormality on                            direct and retroflexion views. Complications:            No immediate complications. Estimated blood loss:                            None. Estimated Blood Loss:     Estimated blood loss: none. Impression:               - Internal hemorrhoids.                           -  The examination was otherwise normal on direct                            and retroflexion views.                           - No specimens collected. Recommendation:           - Repeat colonoscopy at age 63 for screening                            purposes.                           - Patient has a contact number available for                            emergencies. The signs and symptoms of potential                            delayed complications were discussed with the                            patient. Return to normal activities tomorrow.                            Written discharge instructions were provided to the                            patient.                           - Resume previous diet.                           - Continue present medications. Wilhemina Bonito. Marina Goodell, MD 10/17/2017 4:16:40 PM This report has been signed electronically.

## 2017-10-17 NOTE — Telephone Encounter (Signed)
Returned patients call. Patient states he drank the second part of his prep and twenty minutes later he vomited. Patient states that he was having bowel movements and it is mostly clear with a tint of yellow. Patient was instructed to come on in at his scheduled appointment time. I told Xavier Oconnor that he could sip on clear liquids until 12:30 three hours before his procedure at 3:30. Xavier Oconnor states he feels better after he vomited.   Janalee DaneNancy Dayjah Selman, LPN

## 2017-10-20 ENCOUNTER — Telehealth: Payer: Self-pay | Admitting: *Deleted

## 2017-10-20 NOTE — Telephone Encounter (Signed)
No answer for post procedure call back. Left message for patient to call with questions or concerns. Sm 

## 2017-10-20 NOTE — Telephone Encounter (Signed)
Left message on f/u call 

## 2017-11-07 ENCOUNTER — Ambulatory Visit: Payer: 59 | Admitting: Podiatry

## 2017-11-07 ENCOUNTER — Ambulatory Visit (INDEPENDENT_AMBULATORY_CARE_PROVIDER_SITE_OTHER): Payer: 59

## 2017-11-07 ENCOUNTER — Encounter: Payer: Self-pay | Admitting: Podiatry

## 2017-11-07 DIAGNOSIS — M722 Plantar fascial fibromatosis: Secondary | ICD-10-CM | POA: Diagnosis not present

## 2017-11-07 MED ORDER — MELOXICAM 15 MG PO TABS: 15.0000 mg | ORAL_TABLET | Freq: Every day | ORAL | 0 refills | Status: AC

## 2017-11-07 NOTE — Patient Instructions (Signed)

## 2017-11-11 DIAGNOSIS — M722 Plantar fascial fibromatosis: Secondary | ICD-10-CM | POA: Insufficient documentation

## 2017-11-11 NOTE — Progress Notes (Signed)
Subjective: 37 year old male presents the office today for concerns of been not on the arch of his left foot.  I previously saw him last year for plantar fasciitis and a steroid injection performed of the heel.  States that since the last time but not to the arch of the foot has gotten larger.  He still has some occasional heel pain on both sides but overall is better than what it has been.  He states her to get some pain on the right side but 6 months ago this pain is intermittent and mild.  He has not been stretching icing or using anti-inflammatories. Denies any systemic complaints such as fevers, chills, nausea, vomiting. No acute changes since last appointment, and no other complaints at this time.   Objective: AAO x3, NAD DP/PT pulses palpable bilaterally, CRT less than 3 seconds Mild tenderness palpation on the plantar medial tubercle of the calcaneus at the insertion of plantar fascia bilaterally.  The fascia appears to be intact.  His main concern is along the area of the soft tissue mass on the medial band plantar fascia the arch of the left foot.  This is a non-mobile mass this is consistent with a plantar fibroma.  Minimal tenderness palpation of the area today however he does no significant swelling to his foot.  Toes tendon is intact.  No edema, erythema bilaterally.  No open lesions or pre-ulcerative lesions.  No pain with calf compression, swelling, warmth, erythema  Assessment: Bilateral heel pain, plantar fasciitis with left plantar fibromatosis  Plan: -All treatment options discussed with the patient including all alternatives, risks, complications.  -X-rays were obtained and reviewed.  No evidence of calcifications, foreign body or acute fracture. -Regards to soft tissue mass in order to compound cream today through [facility] to include verapamil cream.  Continue stretching, icing daily.  Continue offloading.  Discussed possible steroid injection into this area or both heels.   Minimal pain to the heels today but continue with stretching, icing for this as well as wears well supportive shoes and orthotics.  If symptoms continue or if they worsen we will consider steroid injection but wishes to hold off on this today as his pain is minimal. -Patient encouraged to call the office with any questions, concerns, change in symptoms.   Vivi BarrackMatthew R Deette Revak DPM

## 2017-12-12 ENCOUNTER — Ambulatory Visit: Payer: 59 | Admitting: Podiatry

## 2018-04-28 ENCOUNTER — Other Ambulatory Visit: Payer: Self-pay | Admitting: Nurse Practitioner

## 2018-04-28 ENCOUNTER — Ambulatory Visit
Admission: RE | Admit: 2018-04-28 | Discharge: 2018-04-28 | Disposition: A | Discharge status: Home / Self Care | Payer: Worker's Compensation | Source: Ambulatory Visit | Attending: Nurse Practitioner | Admitting: Nurse Practitioner

## 2018-04-28 DIAGNOSIS — M545 Low back pain, unspecified: Secondary | ICD-10-CM

## 2018-06-26 DIAGNOSIS — M545 Low back pain: Secondary | ICD-10-CM | POA: Insufficient documentation

## 2018-06-26 DIAGNOSIS — M5126 Other intervertebral disc displacement, lumbar region: Secondary | ICD-10-CM | POA: Insufficient documentation

## 2018-07-13 DIAGNOSIS — Q7649 Other congenital malformations of spine, not associated with scoliosis: Secondary | ICD-10-CM | POA: Insufficient documentation

## 2018-07-13 DIAGNOSIS — M5126 Other intervertebral disc displacement, lumbar region: Secondary | ICD-10-CM

## 2018-07-13 HISTORY — DX: Other intervertebral disc displacement, lumbar region: M51.26

## 2018-08-06 ENCOUNTER — Ambulatory Visit (INDEPENDENT_AMBULATORY_CARE_PROVIDER_SITE_OTHER): Payer: 59 | Admitting: Podiatry

## 2018-08-06 ENCOUNTER — Ambulatory Visit (INDEPENDENT_AMBULATORY_CARE_PROVIDER_SITE_OTHER): Payer: 59

## 2018-08-06 ENCOUNTER — Other Ambulatory Visit: Payer: Self-pay | Admitting: Podiatry

## 2018-08-06 ENCOUNTER — Other Ambulatory Visit: Payer: Self-pay

## 2018-08-06 DIAGNOSIS — M792 Neuralgia and neuritis, unspecified: Secondary | ICD-10-CM

## 2018-08-06 DIAGNOSIS — M722 Plantar fascial fibromatosis: Secondary | ICD-10-CM | POA: Diagnosis not present

## 2018-08-06 DIAGNOSIS — M79672 Pain in left foot: Secondary | ICD-10-CM

## 2018-08-06 NOTE — Progress Notes (Signed)
Subjective: 38 year old male presents the office today for concerns and follow-up evaluation of a knot on the bottom of his left foot.  He states that after given injection last year it never completely went away but the tenderness improved.  He states that last week he did see another guy just for the same issue.  He had an injection performed which did soften the knot some however since then he has been having some numbness to his big toe and he had quite a bit of bruising after the injection.  Presents today for follow-up evaluation his main concern is the neuritis. Denies any systemic complaints such as fevers, chills, nausea, vomiting. No acute changes since last appointment, and no other complaints at this time.   Objective: AAO x3, NAD DP/PT pulses palpable bilaterally, CRT less than 3 seconds Of the medial band of the plantar fascia but in the arch of the foot there is a mobile soft tissue mass consistent with a plantar fibroma.  There is recently injection performed in this area.  There is minimal bruising along the site of the injection.  Suggested there is numbness to the medial aspect of the hallux.  No significant tenderness palpation to the actual soft tissue mass. No open lesions or pre-ulcerative lesions.  No pain with calf compression, swelling, warmth, erythema  Assessment: Left foot plantar fibroma, neuritis  Plan: -All treatment options discussed with the patient including all alternatives, risks, complications.  -X-rays reviewed reviewed.  No calcifications no evidence of fracture.  No foreign body. -Regards to the plantar fibroma but hold off another injection he recently had one last week however I did order a compound cream today to include verapamil.  He did not get that I ordered at last appointment.  Offloading pads dispensed.  Discussed pressure exercises.  Also discussed surgical excision. -Regards to the neuritis will continue to monitor.  He may use meloxicam help calm  down the inflammation so this will be helpful. -Patient encouraged to call the office with any questions, concerns, change in symptoms.   Vivi Barrack DPM

## 2018-08-06 NOTE — Patient Instructions (Signed)

## 2018-09-15 ENCOUNTER — Ambulatory Visit: Payer: 59 | Admitting: Podiatry

## 2018-09-17 ENCOUNTER — Ambulatory Visit: Payer: 59 | Admitting: Podiatry

## 2018-09-25 ENCOUNTER — Encounter: Payer: Self-pay | Admitting: Family Medicine

## 2018-09-25 NOTE — Progress Notes (Deleted)
Subjective:    Xavier Oconnor is a 38 y.o. male who presents today for his Complete Annual Exam.    Current Outpatient Medications:  .  cetirizine (ZYRTEC) 10 MG tablet, Take by mouth., Disp: , Rfl:  .  cyclobenzaprine (FLEXERIL) 10 MG tablet, TK 1 T PO  HS PRN FOR MSP, Disp: , Rfl:  .  diclofenac (VOLTAREN) 75 MG EC tablet, TK 1 T PO  BID FOR PAIN, Disp: , Rfl:  .  fluticasone (FLONASE) 50 MCG/ACT nasal spray, USE 1 SPRAY IN EACH NOSTRIL EVERY DAY, Disp: , Rfl:  .  meloxicam (MOBIC) 15 MG tablet, Take 1 tablet (15 mg total) by mouth daily., Disp: 30 tablet, Rfl: 0 .  methocarbamol (ROBAXIN) 500 MG tablet, Robaxin 500 mg tablet  Take 1 tablet 3 times a day by oral route as needed for 10 days., Disp: , Rfl:  .  NON FORMULARY, [facility]  Scar Cream -  Verapamil 10%, Pentoxifylline 5% Apply 1-2 grams to affected area 3-4 times daily Qty. 120 gm 3 refills, Disp: , Rfl:  .  NON FORMULARY, [facility]  CREAMS-#14 SCAR CREAM, Disp: , Rfl:  .  Olopatadine HCl 0.2 % SOLN, Place 1 drop into eye daily., Disp: 2.5 mL, Rfl: 0  Current Facility-Administered Medications:  .  0.9 %  sodium chloride infusion, 500 mL, Intravenous, Once, Irene Shipper, MD  There are no preventive care reminders to display for this patient.  PMHx, SurgHx, SocialHx, Medications, and Allergies were reviewed in the Visit Navigator and updated as appropriate.   Past Medical History:  Diagnosis Date  . Allergy     No past surgical history on file.   Family History  Problem Relation Age of Onset  . Breast cancer Sister   . Colon cancer Neg Hx   . Esophageal cancer Neg Hx   . Rectal cancer Neg Hx   . Stomach cancer Neg Hx     Social History   Tobacco Use  . Smoking status: Never Smoker  . Smokeless tobacco: Never Used  Substance Use Topics  . Alcohol use: Yes    Alcohol/week: 2.0 standard drinks    Types: 2 Cans of beer per week    Comment: occ  . Drug use: No   Review of Systems:    Pertinent items are noted in the HPI. Otherwise, ROS is negative.  Objective:   There were no vitals filed for this visit. There is no height or weight on file to calculate BMI.  General Appearance:  Alert, cooperative, no distress, appears stated age  Head:  Normocephalic, without obvious abnormality, atraumatic  Eyes:  PERRL, conjunctiva/corneas clear, EOM's intact, fundi benign, both eyes       Ears:  Normal TM's and external ear canals, both ears  Nose: Nares normal, septum midline, mucosa normal, no drainage    or sinus tenderness  Throat: Lips, mucosa, and tongue normal; teeth and gums normal  Neck: Supple, symmetrical, trachea midline, no adenopathy; thyroid:  No enlargement/tenderness/nodules; no carotit bruit or JVD  Back:   Symmetric, no curvature, ROM normal, no CVA tenderness  Lungs:   Clear to auscultation bilaterally, respirations unlabored  Chest wall:  No tenderness or deformity  Heart:  Regular rate and rhythm, S1 and S2 normal, no murmur, rub   or gallop  Abdomen:   Soft, non-tender, bowel sounds active all four quadrants, no masses, no organomegaly  Extremities: Extremities normal, atraumatic, no cyanosis or edema  Prostate: Not  done.   Skin: Skin color, texture, turgor normal, no rashes or lesions  Lymph nodes: Cervical, supraclavicular, and axillary nodes normal  Neurologic: CNII-XII grossly intact. Normal strength, sensation and reflexes throughout   Assessment/Plan:   There are no diagnoses linked to this encounter.  Patient Counseling: [x]   Nutrition: Stressed importance of moderation in sodium/caffeine intake, saturated fat and cholesterol, caloric balance, sufficient intake of fresh fruits, vegetables, and fiber.  [x]   Stressed the importance of regular exercise.   []   Substance Abuse: Discussed cessation/primary prevention of tobacco, alcohol, or other drug use; driving or other dangerous activities under the influence; availability of treatment for  abuse.   [x]   Injury prevention: Discussed safety belts, safety helmets, smoke detector, smoking near bedding or upholstery.   []   Sexuality: Discussed sexually transmitted diseases, partner selection, use of condoms, avoidance of unintended pregnancy and contraceptive alternatives.   [x]   Dental health: Discussed importance of regular tooth brushing, flossing, and dental visits.  [x]   Health maintenance and immunizations reviewed. Please refer to Health maintenance section.    Helane RimaErica Chantalle Defilippo, DO [facility]

## 2019-01-08 DIAGNOSIS — M545 Low back pain, unspecified: Secondary | ICD-10-CM | POA: Insufficient documentation

## 2019-07-16 ENCOUNTER — Ambulatory Visit (INDEPENDENT_AMBULATORY_CARE_PROVIDER_SITE_OTHER): Payer: 59 | Admitting: Family Medicine

## 2019-07-16 ENCOUNTER — Encounter: Payer: Self-pay | Admitting: Family Medicine

## 2019-07-16 ENCOUNTER — Other Ambulatory Visit: Payer: Self-pay

## 2019-07-16 VITALS — BP 118/80 | HR 78 | Temp 97.8°F | Resp 15 | Ht 67.0 in | Wt 220.0 lb

## 2019-07-16 DIAGNOSIS — K429 Umbilical hernia without obstruction or gangrene: Secondary | ICD-10-CM | POA: Diagnosis not present

## 2019-07-16 DIAGNOSIS — F524 Premature ejaculation: Secondary | ICD-10-CM | POA: Diagnosis not present

## 2019-07-16 DIAGNOSIS — M5126 Other intervertebral disc displacement, lumbar region: Secondary | ICD-10-CM

## 2019-07-16 DIAGNOSIS — Z0000 Encounter for general adult medical examination without abnormal findings: Secondary | ICD-10-CM | POA: Diagnosis not present

## 2019-07-16 HISTORY — DX: Premature ejaculation: F52.4

## 2019-07-16 LAB — COMPREHENSIVE METABOLIC PANEL
ALT: 29 U/L (ref 0–53)
AST: 17 U/L (ref 0–37)
Albumin: 4.4 g/dL (ref 3.5–5.2)
Alkaline Phosphatase: 90 U/L (ref 39–117)
BUN: 11 mg/dL (ref 6–23)
CO2: 27 mEq/L (ref 19–32)
Calcium: 9.4 mg/dL (ref 8.4–10.5)
Chloride: 104 mEq/L (ref 96–112)
Creatinine, Ser: 1.04 mg/dL (ref 0.40–1.50)
GFR: 95.25 mL/min (ref >60.00)
Glucose, Bld: 95 mg/dL (ref 70–99)
Potassium: 4.1 mEq/L (ref 3.5–5.1)
Sodium: 139 mEq/L (ref 135–145)
Total Bilirubin: 0.9 mg/dL (ref 0.2–1.2)
Total Protein: 7 g/dL (ref 6.0–8.3)

## 2019-07-16 LAB — LIPID PANEL
Cholesterol: 208 mg/dL — ABNORMAL HIGH (ref 0–200)
HDL: 43.5 mg/dL (ref >39.00)
LDL Cholesterol: 146 mg/dL — ABNORMAL HIGH (ref 0–99)
NonHDL: 164.08
Total CHOL/HDL Ratio: 5
Triglycerides: 89 mg/dL (ref 0.0–149.0)
VLDL: 17.8 mg/dL (ref 0.0–40.0)

## 2019-07-16 LAB — CBC WITH DIFFERENTIAL/PLATELET
Basophils Absolute: 0 10*3/uL (ref 0.0–0.1)
Basophils Relative: 1 % (ref 0.0–3.0)
Eosinophils Absolute: 0.3 10*3/uL (ref 0.0–0.7)
Eosinophils Relative: 6.2 % — ABNORMAL HIGH (ref 0.0–5.0)
HCT: 42 % (ref 39.0–52.0)
Hemoglobin: 14.2 g/dL (ref 13.0–17.0)
Lymphocytes Relative: 42.6 % (ref 12.0–46.0)
Lymphs Abs: 1.8 10*3/uL (ref 0.7–4.0)
MCHC: 33.8 g/dL (ref 30.0–36.0)
MCV: 88.3 fl (ref 78.0–100.0)
Monocytes Absolute: 0.6 10*3/uL (ref 0.1–1.0)
Monocytes Relative: 14 % — ABNORMAL HIGH (ref 3.0–12.0)
Neutro Abs: 1.5 10*3/uL (ref 1.4–7.7)
Neutrophils Relative %: 36.2 % — ABNORMAL LOW (ref 43.0–77.0)
Platelets: 254 10*3/uL (ref 150.0–400.0)
RBC: 4.76 Mil/uL (ref 4.22–5.81)
RDW: 13.2 % (ref 11.5–15.5)
WBC: 4.1 10*3/uL (ref 4.0–10.5)

## 2019-07-16 LAB — TSH: TSH: 2.36 u[IU]/mL (ref 0.35–4.50)

## 2019-07-16 MED ORDER — SILDENAFIL CITRATE 50 MG PO TABS: 50.0000 mg | ORAL_TABLET | Freq: Every day | ORAL | 0 refills | Status: DC | PRN

## 2019-07-16 NOTE — Progress Notes (Signed)
Subjective  Chief Complaint  Patient presents with  . Transitions Of Care   HPI: Xavier Oconnor is a 39 y.o. male who presents to [facility] today for a Male Wellness Visit.   Wellness Visit: annual visit with health maintenance review and exam    NP to me; chart reviewed. Basically healthy 57 yo married father of 2; being treated by ortho for lumbar disc herniation and back pain. Has been out of work x 4 months due to problem.   Otherwise doing ok. Needs labs. imms are up to date  Does c/o Premature ejaculation. Has no problems with getting an erection but can't control orgasm. Would like help with this.   Lifestyle: Body mass index is 34.46 kg/m. Wt Readings from Last 3 Encounters:  07/16/19 220 lb (99.8 kg)  10/17/17 220 lb (99.8 kg)  08/15/17 220 lb 12.8 oz (100.2 kg)    Patient Active Problem List   Diagnosis Date Noted  . Premature ejaculation 07/16/2019  . Herniated lumbar disc without myelopathy 07/13/2018  . Obesity (BMI 30-39.9) 10/04/2016  . Hyperhidrosis 10/04/2016  . Umbilical hernia 10/04/2016  . Allergic rhinitis 08/03/2012   Health Maintenance  Topic Date Due  . Janet Berlin  06/28/2027  . HIV Screening  Completed  . INFLUENZA VACCINE  Discontinued   Immunization History  Administered Date(s) Administered  . Tdap 06/27/2017   We updated and reviewed the patient's past history in detail and it is documented below. Allergies: Patient has No Known Allergies. Past Medical History  has a past medical history of Allergy. Past Surgical History  has no past surgical history on file. Social History Patient  reports that he has never smoked. He has never used smokeless tobacco. He reports current alcohol use of about 2.0 standard drinks of alcohol per week. He reports that he does not use drugs. Family History Patient family history includes Breast cancer in his sister; Healthy in his brother, daughter, father, and  mother. Review of Systems: Constitutional: negative for fever or malaise Ophthalmic: negative for photophobia, double vision or loss of vision Cardiovascular: negative for chest pain, dyspnea on exertion, or new LE swelling Respiratory: negative for SOB or persistent cough Gastrointestinal: negative for abdominal pain, change in bowel habits or melena Genitourinary: negative for dysuria or gross hematuria Musculoskeletal: negative for new gait disturbance or muscular weakness Integumentary: negative for new or persistent rashes, no breast lumps Neurological: negative for TIA or stroke symptoms Psychiatric: negative for SI or delusions Allergic/Immunologic: negative for hives  Patient Care Team    Relationship Specialty Notifications Start End  Willow Ora, MD PCP - General Family Medicine  07/16/19   Ortho, [facility]  05/22/18    Objective  Vitals: BP 118/80   Pulse 78   Temp 97.8 F (36.6 C) (Temporal)   Resp 15   Ht 5\' 7"  (1.702 m)   Wt 220 lb (99.8 kg)   SpO2 97%   BMI 34.46 kg/m  General:  Well developed, well nourished, no acute distress  Psych:  Alert and orientedx3,normal mood and affect HEENT:  Normocephalic, atraumatic, non-icteric sclera, PERRL, oropharynx is clear without mass or exudate, supple neck without adenopathy, mass or thyromegaly Cardiovascular:  Normal S1, S2, RRR without gallop, rub or murmur, nondisplaced PMI, +2 distal pulses in bilateral upper and lower extremities. Respiratory:  Good breath sounds bilaterally, CTAB with normal respiratory effort Gastrointestinal: normal bowel sounds, soft, non-tender, no noted masses. No HSM, small  reducible umbilical hernia MSK: no deformities, contusions. Good mm bulk, Joints are without erythema or swelling. Spine and CVA region are nontender Skin:  Warm, no rashes or suspicious lesions noted Neurologic:    Mental status is normal. CN 2-11 are normal. Gross motor and sensory exams are  normal. Stable gait. No tremor GU: No inguinal hernias or adenopathy are appreciated bilaterally  Assessment  1. Annual physical exam   2. Herniated lumbar disc without myelopathy   3. Umbilical hernia without obstruction and without gangrene   4. Premature ejaculation      Plan  Male Wellness Visit:  Age appropriate Health Maintenance and Prevention measures were discussed with patient. Included topics are cancer screening recommendations, ways to keep healthy (see AVS) including dietary and exercise recommendations, regular eye and dental care, use of seat belts, and avoidance of moderate alcohol use and tobacco use.   BMI: discussed patient's BMI and encouraged positive lifestyle modifications to help get to or maintain a target BMI.  HM needs and immunizations were addressed and ordered. See below for orders. See HM and immunization section for updates. utd  Routine labs and screening tests ordered including cmp, cbc and lipids where appropriate.  Discussed recommendations regarding Vit D and calcium supplementation (see AVS)  Premature ejaculation; gave handout on PE. Trial of viagra but may not help. Consider ssri. He will read about pain and squeeze method and return if wants trial of ssri.   Follow up: Return in about 1 year (around 07/15/2020) for complete physical.   Commons side effects, risks, benefits, and alternatives for medications and treatment plan prescribed today were discussed, and the patient expressed understanding of the given instructions. Patient is instructed to call or message via MyChart if he/she has any questions or concerns regarding our treatment plan. No barriers to understanding were identified. We discussed Red Flag symptoms and signs in detail. Patient expressed understanding regarding what to do in case of urgent or emergency type symptoms.   Medication list was reconciled, printed and provided to the patient in AVS. Patient instructions and summary  information was reviewed with the patient as documented in the AVS. This note was prepared with assistance of Dragon voice recognition software. Occasional wrong-word or sound-a-like substitutions may have occurred due to the inherent limitations of voice recognition software This visit occurred during the SARS-CoV-2 public health emergency.  Safety protocols were in place, including screening questions prior to the visit, additional usage of staff PPE, and extensive cleaning of exam room while observing appropriate contact time as indicated for disinfecting solutions.   Orders Placed This Encounter  Procedures  . Comprehensive metabolic panel  . CBC with Differential/Platelet  . Testosterone Total,Free,Bio, Males  . TSH  . Lipid panel   Meds ordered this encounter  Medications  . sildenafil (VIAGRA) 50 MG tablet    Sig: Take 1-2 tablets (50-100 mg total) by mouth daily as needed for erectile dysfunction.    Dispense:  10 tablet    Refill:  0

## 2019-07-16 NOTE — Patient Instructions (Signed)
Please return in 12 months for your annual complete physical; please come fasting. Sooner if needed.   I will release your lab results to you on your MyChart account with further instructions. Please reply with any questions.    It was a pleasure meeting you today! Thank you for choosing [place] to meet your healthcare needs! I truly look forward to working with you. If you have any questions or concerns, please send me a message via Mychart or call the office at [phone].  Please do these things to maintain good health!   Exercise at least 30-45 minutes a day,  4-5 days a week.   Eat a low-fat diet with lots of fruits and vegetables, up to 7-9 servings per day.  Drink plenty of water daily. Try to drink 8 8oz glasses per day.  Seatbelts can save your life. Always wear your seatbelt.  Place Smoke Detectors on every level of your home and check batteries every year.  Eye Doctor - have an eye exam every 1-2 years  Safe sex - use condoms to protect yourself from STDs if you could be exposed to these types of infections.  Avoid heavy alcohol use. If you drink, keep it to less than 2 drinks/day and not every day.  Health Care Power of Attorney.  Choose someone you trust that could speak for you if you became unable to speak for yourself.  Depression is common in our stressful world.If you're feeling down or losing interest in things you normally enjoy, please come in for a visit.  Please read about premature ejaculation. We will try viagra to see if it helps but if not, return to discuss other treatment options.

## 2019-07-19 LAB — TESTOSTERONE TOTAL,FREE,BIO, MALES
Albumin: 4.2 g/dL (ref 3.6–5.1)
Sex Hormone Binding: 34 nmol/L (ref 10–50)
Testosterone, Bioavailable: 110.8 ng/dL (ref >=110.0)
Testosterone, Free: 57.5 pg/mL (ref 46.0–224.0)
Testosterone: 437 ng/dL (ref 250–827)

## 2019-08-16 ENCOUNTER — Other Ambulatory Visit: Payer: Self-pay

## 2019-08-16 ENCOUNTER — Telehealth: Payer: Self-pay

## 2019-08-16 MED ORDER — SILDENAFIL CITRATE 100 MG PO TABS: 50.0000 mg | ORAL_TABLET | Freq: Every day | ORAL | 11 refills | Status: DC | PRN

## 2019-08-16 NOTE — Telephone Encounter (Signed)
New script sent to pharmacy

## 2019-08-16 NOTE — Telephone Encounter (Signed)
Please order viagra 100mg  dose, #15 11 refills

## 2019-08-16 NOTE — Telephone Encounter (Signed)
Patient would for someone to return his call regarding some medication. Patient states that he's not sure of the  name of the medication.

## 2019-08-16 NOTE — Telephone Encounter (Signed)
Patient states that he did not notice much improvement with Viagra 50 mg, but read online he could take up to two. Once patient took two pills, did notice an improvement with ED. Requesting that PCP send in prescription for Viagra 100 mg.

## 2019-12-07 ENCOUNTER — Telehealth: Payer: Self-pay | Admitting: Family Medicine

## 2019-12-07 NOTE — Telephone Encounter (Signed)
  LAST APPOINTMENT DATE: 08/16/2019   NEXT APPOINTMENT DATE:@Visit  date not found  MEDICATION: sildenafil (VIAGRA) 100 MG tablet  PHARMACY: [facility] [phone] - [place], [place] - [address] AT [facility] [address]  COMMENTS: Patient states the pharmacy told him to call to have [place] do a prior authorization and states that his insurance is cancelling today.

## 2019-12-08 NOTE — Telephone Encounter (Signed)
PA through CoverMyMeds has been started.

## 2020-07-17 ENCOUNTER — Encounter: Payer: 59 | Admitting: Family Medicine

## 2020-08-11 ENCOUNTER — Encounter: Payer: Self-pay | Admitting: Family Medicine

## 2020-08-23 ENCOUNTER — Encounter: Payer: Self-pay | Admitting: Family Medicine

## 2020-12-22 ENCOUNTER — Encounter (HOSPITAL_COMMUNITY): Payer: Self-pay

## 2020-12-22 ENCOUNTER — Emergency Department (HOSPITAL_COMMUNITY)
Admission: EM | Admit: 2020-12-22 | Discharge: 2020-12-22 | Disposition: A | Discharge status: Home / Self Care | Payer: Medicaid Other | Attending: Emergency Medicine | Admitting: Emergency Medicine

## 2020-12-22 ENCOUNTER — Emergency Department (HOSPITAL_COMMUNITY): Payer: Medicaid Other

## 2020-12-22 DIAGNOSIS — R1909 Other intra-abdominal and pelvic swelling, mass and lump: Secondary | ICD-10-CM | POA: Diagnosis present

## 2020-12-22 DIAGNOSIS — K429 Umbilical hernia without obstruction or gangrene: Secondary | ICD-10-CM | POA: Diagnosis not present

## 2020-12-22 DIAGNOSIS — R609 Edema, unspecified: Secondary | ICD-10-CM

## 2020-12-22 MED ORDER — LIDOCAINE-EPINEPHRINE (PF) 2 %-1:200000 IJ SOLN
10.0000 mL | Freq: Once | INTRAMUSCULAR | Status: AC
  Administered 2020-12-22: 10 mL
  Filled 2020-12-22: qty 20

## 2020-12-22 NOTE — ED Triage Notes (Signed)
Pt arrived via POV, c/o inguinal hernia enlargement x2 days. Denies any pain at this time.

## 2020-12-22 NOTE — Discharge Instructions (Signed)
You came to the emergency department today to have your groin swelling evaluated.  Your physical exam was consistent with an abscess, this was confirmed by ultrasound.  An incision and drainage performed however no purulent discharge was expressed.  Please perform warm compresses at least 3 times daily.  If swelling does not improve please follow-up with primary care provider.  Get help right away if you: Have severe pain. See red streaks on your skin spreading away from the abscess. You develop a fever

## 2020-12-22 NOTE — ED Provider Notes (Signed)
[facility]-EMERGENCY DEPT Provider Note   CSN: [id number] Arrival date & time: 12/22/20  6387     History Chief Complaint  Patient presents with   Hernia    Xavier Oconnor is a 40 y.o. male with history of abdominal hernia, obesity.  Presents to the emergency department with area of swelling to left groin.  Patient swelling is "a little knot."  Patient reports that he has had episodes of the swelling intermittently.  Swelling is normal the size of his thumb.  Patient reports that yesterday he noticed swelling reports it was "the size of a tennis ball."  Swelling has been constant since then.  Patient reports associated pain.  Pain is worse with movement or touch.  Patient rates pain 5/10 with movement or touch.  At rest patient rates pain 0/10 on pain scale.  Patient has not tried any modalities to alleviate his symptoms.   HPI     Past Medical History:  Diagnosis Date   Allergy     Patient Active Problem List   Diagnosis Date Noted   Premature ejaculation 07/16/2019   Herniated lumbar disc without myelopathy 07/13/2018   Obesity (BMI 30-39.9) 10/04/2016   Hyperhidrosis 10/04/2016   Umbilical hernia 10/04/2016   Allergic rhinitis 08/03/2012    History reviewed. No pertinent surgical history.     Family History  Problem Relation Age of Onset   Healthy Mother    Healthy Father    Breast cancer Sister    Healthy Brother    Healthy Daughter    Colon cancer Neg Hx    Esophageal cancer Neg Hx    Rectal cancer Neg Hx    Stomach cancer Neg Hx     Social History   Tobacco Use   Smoking status: Never   Smokeless tobacco: Never  Substance Use Topics   Alcohol use: Yes    Alcohol/week: 2.0 standard drinks    Types: 2 Cans of beer per week    Comment: occ   Drug use: No    Home Medications Prior to Admission medications   Medication Sig Start Date End Date Taking? Authorizing Provider  cetirizine (ZYRTEC) 10 MG tablet Take by mouth.     [provider]  cyclobenzaprine (FLEXERIL) 10 MG tablet TK 1 T PO  HS PRN FOR [facility] 04/28/18   [provider]  diclofenac (VOLTAREN) 75 MG EC tablet TK 1 T PO  BID FOR PAIN 05/05/18   [provider]  fluticasone (FLONASE) 50 MCG/ACT nasal spray USE 1 SPRAY IN EACH NOSTRIL EVERY DAY 06/21/14   [provider]  methocarbamol (ROBAXIN) 500 MG tablet Robaxin 500 mg tablet  Take 1 tablet 3 times a day by oral route as needed for 10 days.    [provider]  NON FORMULARY [facility]  Scar Cream -  Verapamil 10%, Pentoxifylline 5% Apply 1-2 grams to affected area 3-4 times daily Qty. 120 gm 3 refills    [provider]  NON FORMULARY [facility]  CREAMS-#14 SCAR CREAM    [provider]  Olopatadine HCl 0.2 % SOLN Place 1 drop into eye daily. Patient not taking: Reported on 07/16/2019 01/21/17   Ardith Dark, MD  sildenafil (VIAGRA) 100 MG tablet Take 0.5-1 tablets (50-100 mg total) by mouth daily as needed for erectile dysfunction. 08/16/19   Willow Ora, MD    Allergies    Patient has no known allergies.  Review of Systems   Review  of Systems  Constitutional:  Negative for chills and fever.  Respiratory:  Negative for shortness of breath.   Cardiovascular:  Negative for chest pain.  Gastrointestinal:  Negative for abdominal pain, constipation, diarrhea, nausea and vomiting.  Genitourinary:  Negative for difficulty urinating, dysuria, frequency, genital sores, hematuria, penile discharge, penile pain, penile swelling, scrotal swelling and testicular pain.  Musculoskeletal:  Negative for back pain and neck pain.  Skin:  Negative for color change, pallor, rash and wound.  Allergic/Immunologic: Negative for immunocompromised state.  Neurological:  Negative for dizziness, syncope, light-headedness and headaches.  Psychiatric/Behavioral:  Negative for confusion.    Physical Exam Updated Vital Signs BP 116/77  (BP Location: Left Arm)   Pulse 81   Temp 98 F (36.7 C) (Oral)   Resp 16   SpO2 98%   Physical Exam Vitals and nursing note reviewed.  Constitutional:      General: He is not in acute distress.    Appearance: He is not ill-appearing, toxic-appearing or diaphoretic.  HENT:     Head: Normocephalic.  Eyes:     General: No scleral icterus.       Right eye: No discharge.        Left eye: No discharge.  Cardiovascular:     Rate and Rhythm: Normal rate.  Pulmonary:     Effort: Pulmonary effort is normal.  Abdominal:     General: Abdomen is protuberant. There is no distension. There are no signs of injury.     Palpations: Abdomen is soft. There is no mass or pulsatile mass.     Tenderness: There is no abdominal tenderness. There is no guarding or rebound.     Hernia: A hernia is present. Hernia is present in the umbilical area (Easily reducible).       Comments: 2cm x 1cm area of induration.  No overlying erythema, rash, wounds.  No fluctuance.  No purulent discharge.  Skin:    General: Skin is warm and dry.  Neurological:     General: No focal deficit present.     Mental Status: He is alert.  Psychiatric:        Behavior: Behavior is cooperative.    ED Results / Procedures / Treatments   Labs (all labs ordered are listed, but only abnormal results are displayed) Labs Reviewed - No data to display  EKG None  Radiology [place] LT LOWER EXTREM LTD SOFT TISSUE NON VASCULAR  Result Date: 12/22/2020 CLINICAL DATA:  Left groin swelling. EXAM: ULTRASOUND left LOWER EXTREMITY LIMITED TECHNIQUE: Ultrasound examination of the lower extremity soft tissues was performed in the area of clinical concern. COMPARISON:  None. FINDINGS: Complex but predominantly hypoechoic abnormality measuring 2.0 x 0.9 x 0.6 cm is noted in the left groin region which contains internal echoes and is concerning for complex fluid collection such as hematoma or abscess. No definite hernia is noted. Doppler  demonstrates no internal blood flow with this abnormality. IMPRESSION: 2.0 x 0.9 x 0.6 cm complex but predominantly hypoechoic abnormality is noted in the left groin region which is concerning for complex fluid collection such as hematoma or abscess. Electronically Signed   By: Lupita Raider M.D.   On: 12/22/2020 13:05    Procedures .Marland KitchenIncision and Drainage  Date/Time: 12/22/2020 6:20 PM Performed by: Haskel Schroeder, PA-C Authorized by: Haskel Schroeder, PA-C   Consent:    Consent obtained:  Verbal   Consent given by:  Patient   Risks discussed:  Bleeding, incomplete drainage, pain,  damage to other organs and infection   Alternatives discussed:  No treatment and delayed treatment Universal protocol:    Procedure explained and questions answered to patient or proxy's satisfaction: yes     Immediately prior to procedure, a time out was called: yes     Patient identity confirmed:  Verbally with patient Location:    Type:  Abscess   Location:  Anogenital   Anogenital location: right groin. Pre-procedure details:    Skin preparation:  Betadine Anesthesia:    Anesthesia method:  Local infiltration   Local anesthetic:  Lidocaine 2% WITH epi Procedure type:    Complexity:  Simple Procedure details:    Incision types:  Single straight   Incision depth:  Subcutaneous   Drainage:  Bloody   Drainage amount:  Scant   Wound treatment:  Wound left open Post-procedure details:    Procedure completion:  Tolerated well, no immediate complications   Medications Ordered in ED Medications - No data to display  ED Course  I have reviewed the triage vital signs and the nursing notes.  Pertinent labs & imaging results that were available during my care of the patient were reviewed by me and considered in my medical decision making (see chart for details).    MDM Rules/Calculators/A&P                           Alert 40 year old male in no acute distress, nontoxic appearing.   Presents to ED with chief complaint of swelling to left groin.  On physical exam patient has tenderness to a centimeter by 1 cm area of induration to left groin.  Suspect possible abscess.  Will obtain ultrasound imaging to evaluate further.  Ultrasound imaging shows complex but predominantly hypoechoic abnormality measuring 2.0 x 0.9 x 0.6 cm is noted in the left groin region which contains internal echoes.  Detail conversation with patient about incision and drainage procedure.  Patient elects to perform incision and drainage today.  Procedure as noted above.  Scant bloody drainage noted.  Discussed results, findings, treatment and follow up. Patient advised of return precautions. Patient verbalized understanding and agreed with plan.   Final Clinical Impression(s) / ED Diagnoses Final diagnoses:  Swelling  Groin swelling    Rx / DC Orders ED Discharge Orders     None        Berneice Heinrich 12/22/20 1823    Linwood Dibbles, MD 12/23/20 [phone]

## 2021-01-17 ENCOUNTER — Other Ambulatory Visit: Payer: Self-pay

## 2021-01-17 ENCOUNTER — Ambulatory Visit (INDEPENDENT_AMBULATORY_CARE_PROVIDER_SITE_OTHER): Payer: Medicaid Other | Admitting: Family Medicine

## 2021-01-17 ENCOUNTER — Encounter: Payer: Self-pay | Admitting: Family Medicine

## 2021-01-17 VITALS — BP 110/76 | HR 78 | Temp 97.5°F | Ht 67.0 in | Wt 219.2 lb

## 2021-01-17 DIAGNOSIS — E669 Obesity, unspecified: Secondary | ICD-10-CM | POA: Diagnosis not present

## 2021-01-17 DIAGNOSIS — M5126 Other intervertebral disc displacement, lumbar region: Secondary | ICD-10-CM

## 2021-01-17 DIAGNOSIS — K429 Umbilical hernia without obstruction or gangrene: Secondary | ICD-10-CM

## 2021-01-17 DIAGNOSIS — Z0000 Encounter for general adult medical examination without abnormal findings: Secondary | ICD-10-CM | POA: Diagnosis not present

## 2021-01-17 DIAGNOSIS — F524 Premature ejaculation: Secondary | ICD-10-CM

## 2021-01-17 MED ORDER — SILDENAFIL CITRATE 100 MG PO TABS: 50.0000 mg | ORAL_TABLET | Freq: Every day | ORAL | 11 refills | Status: DC | PRN

## 2021-01-17 MED ORDER — DICLOFENAC SODIUM 75 MG PO TBEC
75.0000 mg | DELAYED_RELEASE_TABLET | Freq: Two times a day (BID) | ORAL | 0 refills | Status: DC
Start: 2021-01-17 — End: 2021-05-29

## 2021-01-17 MED ORDER — CYCLOBENZAPRINE HCL 10 MG PO TABS: 10.0000 mg | ORAL_TABLET | Freq: Every evening | ORAL | 0 refills | Status: DC | PRN

## 2021-01-17 NOTE — Progress Notes (Signed)
Subjective  Chief Complaint  Patient presents with   Annual Exam   Back Pain    Ongoing, has went to therapy for it    HPI: Xavier Oconnor is a 40 y.o. male who presents to [facility] [place] today for a Male Wellness Visit. He also has the concerns and/or needs as listed above in the chief complaint. These will be addressed in addition to the Health Maintenance Visit.   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 Overall doing well.   Body mass index is 34.33 kg/m. Wt Readings from Last 3 Encounters:  01/17/21 219 lb 3.2 oz (99.4 kg)  07/16/19 220 lb (99.8 kg)  10/17/17 220 lb (99.8 kg)     Chronic disease management visit and/or acute problem visit: Lbp: started again yesterday after heavy lifting with a delivery. No radicular sxs. Has known herniated disc that he had recovered from last year. No B/B dysfunction.  Reviewed recent UC notes: cyst with swelling. Resolving.  Premature ejaculation; no ED. Ongoing but mild sxs.  Small umbilical hernia: no pain  Patient Active Problem List   Diagnosis Date Noted   Premature ejaculation 07/16/2019   Herniated lumbar disc without myelopathy 07/13/2018   Obesity (BMI 30-39.9) 10/04/2016   Hyperhidrosis 10/04/2016   Umbilical hernia 10/04/2016   Allergic rhinitis 08/03/2012   Health Maintenance  Topic Date Due   Hepatitis C Screening  Never done   TETANUS/TDAP  06/28/2027   HIV Screening  Completed   HPV VACCINES  Aged Out   INFLUENZA VACCINE  Discontinued   COVID-19 Vaccine  Discontinued   Immunization History  Administered Date(s) Administered   Tdap 06/27/2017   We updated and reviewed the patient's past history in detail and it is documented below. Allergies: Patient has No Known Allergies. Past Medical History  has a past medical history of Allergy. Past Surgical History Patient  has no past surgical history  on file. Social History Patient  reports that he has never smoked. He has never used smokeless tobacco. He reports current alcohol use of about 2.0 standard drinks per week. He reports that he does not use drugs. Family History family history includes Breast cancer in his sister; Healthy in his brother, daughter, father, and mother. Review of Systems: Constitutional: negative for fever or malaise Ophthalmic: negative for photophobia, double vision or loss of vision Cardiovascular: negative for chest pain, dyspnea on exertion, or new LE swelling Respiratory: negative for SOB or persistent cough Gastrointestinal: negative for abdominal pain, change in bowel habits or melena Genitourinary: negative for dysuria or gross hematuria Musculoskeletal: negative for new gait disturbance or muscular weakness Integumentary: negative for new or persistent rashes Neurological: negative for TIA or stroke symptoms Psychiatric: negative for SI or delusions Allergic/Immunologic: negative for hives  Patient Care Team    Relationship Specialty Notifications Start End  Willow Ora, MD PCP - General Family Medicine  07/16/19   Ortho, [facility]  05/22/18    Objective  Vitals: BP 110/76   Pulse 78   Temp (!) 97.5 F (36.4 C) (Temporal)   Ht 5\' 7"  (1.702 m)   Wt 219 lb 3.2 oz (99.4 kg)   SpO2 94%   BMI 34.33 kg/m  General:  Well developed, well nourished, no acute distress  Psych:  Alert and orientedx3,normal mood and affect HEENT:  Normocephalic, atraumatic, non-icteric sclera, PERRL, oropharynx  is clear without mass or exudate, supple neck without adenopathy, mass or thyromegaly Cardiovascular:  Normal S1, S2, RRR without gallop, rub or murmur, nondisplaced PMI, +2 distal pulses in bilateral upper and lower extremities. Respiratory:  Good breath sounds bilaterally, CTAB with normal respiratory effort Gastrointestinal: normal bowel sounds, soft, non-tender, no noted masses.  No HSM, small reducible umbilical hernia MSK: no deformities, contusions. Joints are without erythema or swelling. Spine and CVA region are nontender Skin:  Warm, no rashes or suspicious lesions noted Neurologic:    Mental status is normal. CN 2-11 are normal. Gross motor and sensory exams are normal. No tremor Back: antalgic gait. Decreased flexion due to pain. Neg slr bilaterally GU: No inguinal hernias or adenopathy are appreciated bilaterally, left small nontender cyst left mons area   Assessment  1. Annual physical exam   2. Herniated lumbar disc without myelopathy   3. Obesity (BMI 30-39.9)   4. Umbilical hernia without obstruction and without gangrene   5. Premature ejaculation      Plan  Male Wellness Visit: Age appropriate Health Maintenance and Prevention measures were discussed with patient. Included topics are cancer screening recommendations, ways to keep healthy (see AVS) including dietary and exercise recommendations, regular eye and dental care, use of seat belts, and avoidance of moderate alcohol use and tobacco use.  BMI: discussed patient's BMI and encouraged positive lifestyle modifications to help get to or maintain a target BMI. HM needs and immunizations were addressed and ordered. See below for orders. See HM and immunization section for updates.declines vaccines Routine labs and screening tests ordered including cmp, cbc and lipids where appropriate. Discussed recommendations regarding Vit D and calcium supplementation (see AVS)  Chronic disease f/u and/or acute problem visit: (deemed necessary to be done in addition to the wellness visit): HNP lumbar flare vs mm strain: stretching, heat/ice, voltaren and flexeril at night. F/u if needed Hernia: monitor.  Premature ejaculation: counseled on options of care. He declines SSRI at this time. Continue viagra: mildly helps. Cyst: reassured. No infection now. Reassuring ultrasound  Follow up: 52mo cpe  Commons side  effects, risks, benefits, and alternatives for medications and treatment plan prescribed today were discussed, and the patient expressed understanding of the given instructions. Patient is instructed to call or message via MyChart if he/she has any questions or concerns regarding our treatment plan. No barriers to understanding were identified. We discussed Red Flag symptoms and signs in detail. Patient expressed understanding regarding what to do in case of urgent or emergency type symptoms.  Medication list was reconciled, printed and provided to the patient in AVS. Patient instructions and summary information was reviewed with the patient as documented in the AVS. This note was prepared with assistance of Dragon voice recognition software. Occasional wrong-word or sound-a-like substitutions may have occurred due to the inherent limitations of voice recognition software  This visit occurred during the SARS-CoV-2 public health emergency.  Safety protocols were in place, including screening questions prior to the visit, additional usage of staff PPE, and extensive cleaning of exam room while observing appropriate contact time as indicated for disinfecting solutions.   Orders Placed This Encounter  Procedures   CBC with Differential/Platelet   Comprehensive metabolic panel   Lipid panel   TSH   Hepatitis C antibody   HIV Antibody (routine testing w rflx)   Meds ordered this encounter  Medications   diclofenac (VOLTAREN) 75 MG EC tablet    Sig: Take 1 tablet (75 mg total) by mouth  2 (two) times daily.    Dispense:  30 tablet    Refill:  0   cyclobenzaprine (FLEXERIL) 10 MG tablet    Sig: Take 1 tablet (10 mg total) by mouth at bedtime as needed for muscle spasms.    Dispense:  30 tablet    Refill:  0   sildenafil (VIAGRA) 100 MG tablet    Sig: Take 0.5-1 tablets (50-100 mg total) by mouth daily as needed for erectile dysfunction.    Dispense:  15 tablet    Refill:  11

## 2021-01-17 NOTE — Patient Instructions (Signed)
Please return in 12 months for your annual complete physical; please come fasting.   I will release your lab results to you on your MyChart account with further instructions. Please reply with any questions.    If you have any questions or concerns, please don't hesitate to send me a message via MyChart or call the office at [phone]. Thank you for visiting with Xavier Oconnor today! It's our pleasure caring for you.   Back Exercises The following exercises strengthen the muscles that help to support the trunk (torso) and back. They also help to keep the lower back flexible. Doing these exercises can help to prevent or lessen existing low back pain. If you have back pain or discomfort, try doing these exercises 2-3 times each day or as told by your health care provider. As your pain improves, do them once each day, but increase the number of times that you repeat the steps for each exercise (do more repetitions). To prevent the recurrence of back pain, continue to do these exercises once each day or as told by your health care provider. Do exercises exactly as told by your health care provider and adjust them as directed. It is normal to feel mild stretching, pulling, tightness, or discomfort as you do these exercises, but you should stop right away if you feel sudden pain or your pain gets worse. Exercises Single knee to chest Repeat these steps 3-5 times for each leg: Lie on your back on a firm bed or the floor with your legs extended. Bring one knee to your chest. Your other leg should stay extended and in contact with the floor. Hold your knee in place by grabbing your knee or thigh with both hands and hold. Pull on your knee until you feel a gentle stretch in your lower back or buttocks. Hold the stretch for 10-30 seconds. Slowly release and straighten your leg. Pelvic tilt Repeat these steps 5-10 times: Lie on your back on a firm bed or the floor with your legs extended. Bend your knees so they  are pointing toward the ceiling and your feet are flat on the floor. Tighten your lower abdominal muscles to press your lower back against the floor. This motion will tilt your pelvis so your tailbone points up toward the ceiling instead of pointing to your feet or the floor. With gentle tension and even breathing, hold this position for 5-10 seconds. Cat-cow Repeat these steps until your lower back becomes more flexible: Get into a hands-and-knees position on a firm bed or the floor. Keep your hands under your shoulders, and keep your knees under your hips. You may place padding under your knees for comfort. Let your head hang down toward your chest. Contract your abdominal muscles and point your tailbone toward the floor so your lower back becomes rounded like the back of a cat. Hold this position for 5 seconds. Slowly lift your head, let your abdominal muscles relax, and point your tailbone up toward the ceiling so your back forms a sagging arch like the back of a cow. Hold this position for 5 seconds.  Press-ups Repeat these steps 5-10 times: Lie on your abdomen (face-down) on a firm bed or the floor. Place your palms near your head, about shoulder-width apart. Keeping your back as relaxed as possible and keeping your hips on the floor, slowly straighten your arms to raise the top half of your body and lift your shoulders. Do not use your back muscles to raise your upper torso.  You may adjust the placement of your hands to make yourself more comfortable. Hold this position for 5 seconds while you keep your back relaxed. Slowly return to lying flat on the floor.  Bridges Repeat these steps 10 times: Lie on your back on a firm bed or the floor. Bend your knees so they are pointing toward the ceiling and your feet are flat on the floor. Your arms should be flat at your sides, next to your body. Tighten your buttocks muscles and lift your buttocks off the floor until your waist is at almost  the same height as your knees. You should feel the muscles working in your buttocks and the back of your thighs. If you do not feel these muscles, slide your feet 1-2 inches (2.5-5 cm) farther away from your buttocks. Hold this position for 3-5 seconds. Slowly lower your hips to the starting position, and allow your buttocks muscles to relax completely. If this exercise is too easy, try doing it with your arms crossed over your chest. Abdominal crunches Repeat these steps 5-10 times: Lie on your back on a firm bed or the floor with your legs extended. Bend your knees so they are pointing toward the ceiling and your feet are flat on the floor. Cross your arms over your chest. Tip your chin slightly toward your chest without bending your neck. Tighten your abdominal muscles and slowly raise your torso high enough to lift your shoulder blades a tiny bit off the floor. Avoid raising your torso higher than that because it can put too much stress on your lower back and does not help to strengthen your abdominal muscles. Slowly return to your starting position. Back lifts Repeat these steps 5-10 times: Lie on your abdomen (face-down) with your arms at your sides, and rest your forehead on the floor. Tighten the muscles in your legs and your buttocks. Slowly lift your chest off the floor while you keep your hips pressed to the floor. Keep the back of your head in line with the curve in your back. Your eyes should be looking at the floor. Hold this position for 3-5 seconds. Slowly return to your starting position. Contact a health care provider if: Your back pain or discomfort gets much worse when you do an exercise. Your worsening back pain or discomfort does not lessen within 2 hours after you exercise. If you have any of these problems, stop doing these exercises right away. Do not do them again unless your health care provider says that you can. Get help right away if: You develop sudden, severe  back pain. If this happens, stop doing the exercises right away. Do not do them again unless your health care provider says that you can. This information is not intended to replace advice given to you by your health care provider. Make sure you discuss any questions you have with your health care provider. Document Revised: 06/07/2020 Document Reviewed: 06/07/2020 Elsevier Patient Education  2022 [employer].

## 2021-01-18 LAB — CBC WITH DIFFERENTIAL/PLATELET
Basophils Absolute: 0 10*3/uL (ref 0.0–0.1)
Basophils Relative: 0.8 % (ref 0.0–3.0)
Eosinophils Absolute: 0.2 10*3/uL (ref 0.0–0.7)
Eosinophils Relative: 4.3 % (ref 0.0–5.0)
HCT: 42.3 % (ref 39.0–52.0)
Hemoglobin: 14.1 g/dL (ref 13.0–17.0)
Lymphocytes Relative: 43.6 % (ref 12.0–46.0)
Lymphs Abs: 2.1 10*3/uL (ref 0.7–4.0)
MCHC: 33.2 g/dL (ref 30.0–36.0)
MCV: 89 fl (ref 78.0–100.0)
Monocytes Absolute: 0.7 10*3/uL (ref 0.1–1.0)
Monocytes Relative: 14.8 % — ABNORMAL HIGH (ref 3.0–12.0)
Neutro Abs: 1.8 10*3/uL (ref 1.4–7.7)
Neutrophils Relative %: 36.5 % — ABNORMAL LOW (ref 43.0–77.0)
Platelets: 240 10*3/uL (ref 150.0–400.0)
RBC: 4.76 Mil/uL (ref 4.22–5.81)
RDW: 12.9 % (ref 11.5–15.5)
WBC: 4.8 10*3/uL (ref 4.0–10.5)

## 2021-01-18 LAB — COMPREHENSIVE METABOLIC PANEL
ALT: 20 U/L (ref 0–53)
AST: 17 U/L (ref 0–37)
Albumin: 4.5 g/dL (ref 3.5–5.2)
Alkaline Phosphatase: 88 U/L (ref 39–117)
BUN: 15 mg/dL (ref 6–23)
CO2: 29 mEq/L (ref 19–32)
Calcium: 9.4 mg/dL (ref 8.4–10.5)
Chloride: 105 mEq/L (ref 96–112)
Creatinine, Ser: 1.05 mg/dL (ref 0.40–1.50)
GFR: 87.57 mL/min (ref >60.00)
Glucose, Bld: 85 mg/dL (ref 70–99)
Potassium: 4.6 mEq/L (ref 3.5–5.1)
Sodium: 140 mEq/L (ref 135–145)
Total Bilirubin: 0.8 mg/dL (ref 0.2–1.2)
Total Protein: 7.2 g/dL (ref 6.0–8.3)

## 2021-01-18 LAB — LIPID PANEL
Cholesterol: 196 mg/dL (ref 0–200)
HDL: 50.5 mg/dL (ref >39.00)
LDL Cholesterol: 108 mg/dL — ABNORMAL HIGH (ref 0–99)
NonHDL: 145.04
Total CHOL/HDL Ratio: 4
Triglycerides: 185 mg/dL — ABNORMAL HIGH (ref 0.0–149.0)
VLDL: 37 mg/dL (ref 0.0–40.0)

## 2021-01-18 LAB — HEPATITIS C ANTIBODY
Hepatitis C Ab: NONREACTIVE
SIGNAL TO CUT-OFF: 0.06 (ref <1.00)

## 2021-01-18 LAB — HIV ANTIBODY (ROUTINE TESTING W REFLEX): HIV 1&2 Ab, 4th Generation: NONREACTIVE

## 2021-01-18 LAB — TSH: TSH: 1.7 u[IU]/mL (ref 0.35–5.50)

## 2021-01-19 ENCOUNTER — Telehealth: Payer: Self-pay

## 2021-01-19 NOTE — Telephone Encounter (Signed)
Patient calling back stating that the pharmacy states that cyclobenzaprine wasn't covered and wants to see if something else can be sent in.

## 2021-01-19 NOTE — Telephone Encounter (Signed)
Please advise 

## 2021-01-23 NOTE — Telephone Encounter (Signed)
Patient would like a PA done to see if we can get the medication approved. Will get one started

## 2021-01-23 NOTE — Telephone Encounter (Signed)
Patient is wanting to know about his lab work from recent visit

## 2021-01-25 NOTE — Telephone Encounter (Signed)
Patient states cyclobenzaprine does not need a PA, will let me know which medication he needs a PA for

## 2021-02-05 ENCOUNTER — Telehealth: Payer: Self-pay

## 2021-02-05 NOTE — Telephone Encounter (Signed)
fyi

## 2021-02-05 NOTE — Telephone Encounter (Signed)
Patient states he is unavailable for an appointment today, and will call back with his schedule.   Nurse Assessment Nurse: [name], RN, [name] Date/Time Xavier Oconnor Time): 02/02/2021 11:40:13 PM Confirm and document reason for call. If symptomatic, describe symptoms. ---Caller states that he woke up this morning with a sty on the r eye not red but swollen. Small bumps on the eye. Does the patient have any new or worsening symptoms? ---Yes Will a triage be completed? ---Yes Related visit to physician within the last 2 weeks? ---No Does the PT have any chronic conditions? (i.e. diabetes, asthma, this includes High risk factors for pregnancy, etc.) ---No Is this a behavioral health or substance abuse call? ---No Guidelines Guideline Title Affirmed Question Affirmed Notes Nurse Date/Time Xavier Oconnor Time) Sty Longstanding or recurring problems with styes [name], RN, [name] 02/02/2021 11:41:20 PM Disp. Time Xavier Oconnor Time) Disposition Final User PLEASE NOTE: All timestamps contained within this report are represented as [name]. CONFIDENTIALTY NOTICE: This fax transmission is intended only for the addressee. It contains information that is legally privileged, confidential or otherwise protected from use or disclosure. If you are not the intended recipient, you are strictly prohibited from reviewing, disclosing, copying using or disseminating any of this information or taking any action in reliance on or regarding this information. If you have received this fax in error, please notify us immediately by telephone so that we can arrange for its return to [place]. Phone: [phone], Toll-Free: [phone], Fax: [phone] Page: 2 of 2 Call Id: [id number] 02/02/2021 11:43:13 PM SEE PCP WITHIN 3 DAYS Yes [name], RN, [name] Caller Disagree/Comply [name] Understands Yes PreDisposition Call Doctor Care Advice Given Per Guideline SEE PCP WITHIN 3 DAYS: LOCAL  HEAT: * Apply a warm, wet washcloth to the eye for 10 minutes 4 times a day to help the sty come to a head. CALL BACK IF: CARE ADVICE given per Sty (Adult) guideline

## 2021-05-29 ENCOUNTER — Telehealth: Payer: Medicaid Other | Admitting: Physician Assistant

## 2021-05-29 ENCOUNTER — Encounter: Payer: Self-pay | Admitting: Physician Assistant

## 2021-05-29 DIAGNOSIS — R21 Rash and other nonspecific skin eruption: Secondary | ICD-10-CM

## 2021-05-29 DIAGNOSIS — U071 COVID-19: Secondary | ICD-10-CM

## 2021-05-29 MED ORDER — BENZONATATE 100 MG PO CAPS: 100.0000 mg | ORAL_CAPSULE | Freq: Three times a day (TID) | ORAL | 0 refills | Status: DC | PRN

## 2021-05-29 MED ORDER — VALACYCLOVIR HCL 1 G PO TABS: 2000.0000 mg | ORAL_TABLET | Freq: Two times a day (BID) | ORAL | 0 refills | Status: AC

## 2021-05-29 NOTE — Patient Instructions (Signed)
Sevag Clare Gandy, thank you for joining Piedad Climes, PA-C for today's virtual visit.  While this provider is not your primary care provider (PCP), if your PCP is located in our provider database this encounter information will be shared with them immediately following your visit.  Consent: (Patient) Xavier Oconnor provided verbal consent for this virtual visit at the beginning of the encounter.  Current Medications:  Current Outpatient Medications:    cetirizine (ZYRTEC) 10 MG tablet, Take by mouth., Disp: , Rfl:    fluticasone (FLONASE) 50 MCG/ACT nasal spray, USE 1 SPRAY IN EACH NOSTRIL EVERY DAY, Disp: , Rfl:    sildenafil (VIAGRA) 100 MG tablet, Take 0.5-1 tablets (50-100 mg total) by mouth daily as needed for erectile dysfunction., Disp: 15 tablet, Rfl: 11   Medications ordered in this encounter:  No orders of the defined types were placed in this encounter.    *If you need refills on other medications prior to your next appointment, please contact your pharmacy*  Follow-Up: Call back or seek an in-person evaluation if the symptoms worsen or if the condition fails to improve as anticipated.  Other Instructions Take the Valacyclovir as directed. Wash hands and avoid touching the face. Any worsening symptoms or any eye pain or vision change -- ER ASAP.   Please keep well-hydrated and get plenty of rest. Start a saline nasal rinse to flush out your nasal passages. You can use plain Mucinex to help thin congestion. If you have a humidifier, running in the bedroom at night. I want you to start OTC vitamin D3 1000 units daily, vitamin C 1000 mg daily, and a zinc supplement. Please take prescribed medications as directed.  You have been enrolled in a MyChart symptom monitoring program. Please answer these questions daily so we can keep track of how you are doing.  You were to quarantine for 5 days from onset of your symptoms.  After day 5, if you have had no fever and you  are feeling better, you can end quarantine but need to mask for an additional 5 days. After day 5 if you have a fever or are having significant symptoms, please quarantine for full 10 days.  If you note any worsening of symptoms, any significant shortness of breath or any chest pain, please seek ER evaluation ASAP.  Please do not delay care!  COVID-19: What to Do if You Are Sick If you test positive and are an older adult or someone who is at high risk of getting very sick from COVID-19, treatment may be available. Contact a healthcare provider right away after a positive test to determine if you are eligible, even if your symptoms are mild right now. You can also visit a Test to Treat location and, if eligible, receive a prescription from a provider. Don't delay: Treatment must be started within the first few days to be effective. If you have a fever, cough, or other symptoms, you might have COVID-19. Most people have mild illness and are able to recover at home. If you are sick: Keep track of your symptoms. If you have an emergency warning sign (including trouble breathing), call 911. Steps to help prevent the spread of COVID-19 if you are sick If you are sick with COVID-19 or think you might have COVID-19, follow the steps below to care for yourself and to help protect other people in your home and community. Stay home except to get medical care Stay home. Most people with COVID-19 have mild illness  and can recover at home without medical care. Do not leave your home, except to get medical care. Do not visit public areas and do not go to places where you are unable to wear a mask. Take care of yourself. Get rest and stay hydrated. Take over-the-counter medicines, such as acetaminophen, to help you feel better. Stay in touch with your doctor. Call before you get medical care. Be sure to get care if you have trouble breathing, or have any other emergency warning signs, or if you think it is an  emergency. Avoid public transportation, ride-sharing, or taxis if possible. Get tested If you have symptoms of COVID-19, get tested. While waiting for test results, stay away from others, including staying apart from those living in your household. Get tested as soon as possible after your symptoms start. Treatments may be available for people with COVID-19 who are at risk for becoming very sick. Don't delay: Treatment must be started early to be effective--some treatments must begin within 5 days of your first symptoms. Contact your healthcare provider right away if your test result is positive to determine if you are eligible. Self-tests are one of several options for testing for the virus that causes COVID-19 and may be more convenient than laboratory-based tests and point-of-care tests. Ask your healthcare provider or your local health department if you need help interpreting your test results. You can visit your state, tribal, local, and territorial health department's website to look for the latest local information on testing sites. Separate yourself from other people As much as possible, stay in a specific room and away from other people and pets in your home. If possible, you should use a separate bathroom. If you need to be around other people or animals in or outside of the home, wear a well-fitting mask. Tell your close contacts that they may have been exposed to COVID-19. An infected person can spread COVID-19 starting 48 hours (or 2 days) before the person has any symptoms or tests positive. By letting your close contacts know they may have been exposed to COVID-19, you are helping to protect everyone. See COVID-19 and Animals if you have questions about pets. If you are diagnosed with COVID-19, someone from the health department may call you. Answer the call to slow the spread. Monitor your symptoms Symptoms of COVID-19 include fever, cough, or other symptoms. Follow care instructions  from your healthcare provider and local health department. Your local health authorities may give instructions on checking your symptoms and reporting information. When to seek emergency medical attention Look for emergency warning signs* for COVID-19. If someone is showing any of these signs, seek emergency medical care immediately: Trouble breathing Persistent pain or pressure in the chest New confusion Inability to wake or stay awake Pale, gray, or blue-colored skin, lips, or nail beds, depending on skin tone *This list is not all possible symptoms. Please call your medical provider for any other symptoms that are severe or concerning to you. Call 911 or call ahead to your local emergency facility: Notify the operator that you are seeking care for someone who has or may have COVID-19. Call ahead before visiting your doctor Call ahead. Many medical visits for routine care are being postponed or done by phone or telemedicine. If you have a medical appointment that cannot be postponed, call your doctor's office, and tell them you have or may have COVID-19. This will help the office protect themselves and other patients. If you are sick, wear a well-fitting  mask You should wear a mask if you must be around other people or animals, including pets (even at home). Wear a mask with the best fit, protection, and comfort for you. You don't need to wear the mask if you are alone. If you can't put on a mask (because of trouble breathing, for example), cover your coughs and sneezes in some other way. Try to stay at least 6 feet away from other people. This will help protect the people around you. Masks should not be placed on young children under age 69 years, anyone who has trouble breathing, or anyone who is not able to remove the mask without help. Cover your coughs and sneezes Cover your mouth and nose with a tissue when you cough or sneeze. Throw away used tissues in a lined trash can. Immediately  wash your hands with soap and water for at least 20 seconds. If soap and water are not available, clean your hands with an alcohol-based hand sanitizer that contains at least 60% alcohol. Clean your hands often Wash your hands often with soap and water for at least 20 seconds. This is especially important after blowing your nose, coughing, or sneezing; going to the bathroom; and before eating or preparing food. Use hand sanitizer if soap and water are not available. Use an alcohol-based hand sanitizer with at least 60% alcohol, covering all surfaces of your hands and rubbing them together until they feel dry. Soap and water are the best option, especially if hands are visibly dirty. Avoid touching your eyes, nose, and mouth with unwashed hands. Handwashing Tips Avoid sharing personal household items Do not share dishes, drinking glasses, cups, eating utensils, towels, or bedding with other people in your home. Wash these items thoroughly after using them with soap and water or put in the dishwasher. Clean surfaces in your home regularly Clean and disinfect high-touch surfaces (for example, doorknobs, tables, handles, light switches, and countertops) in your "sick room" and bathroom. In shared spaces, you should clean and disinfect surfaces and items after each use by the person who is ill. If you are sick and cannot clean, a caregiver or other person should only clean and disinfect the area around you (such as your bedroom and bathroom) on an as needed basis. Your caregiver/other person should wait as long as possible (at least several hours) and wear a mask before entering, cleaning, and disinfecting shared spaces that you use. Clean and disinfect areas that may have blood, stool, or body fluids on them. Use household cleaners and disinfectants. Clean visible dirty surfaces with household cleaners containing soap or detergent. Then, use a household disinfectant. Use a product from [employer] List N:  Disinfectants for Coronavirus (COVID-19). Be sure to follow the instructions on the label to ensure safe and effective use of the product. Many products recommend keeping the surface wet with a disinfectant for a certain period of time (look at "contact time" on the product label). You may also need to wear personal protective equipment, such as gloves, depending on the directions on the product label. Immediately after disinfecting, wash your hands with soap and water for 20 seconds. For completed guidance on cleaning and disinfecting your home, visit Complete Disinfection Guidance. Take steps to improve ventilation at home Improve ventilation (air flow) at home to help prevent from spreading COVID-19 to other people in your household. Clear out COVID-19 virus particles in the air by opening windows, using air filters, and turning on fans in your home. Use this interactive  tool to learn how to improve air flow in your home. When you can be around others after being sick with COVID-19 Deciding when you can be around others is different for different situations. Find out when you can safely end home isolation. For any additional questions about your care, contact your healthcare provider or state or local health department. 06/27/2020 Content source: [facility] Immun[facility] and [facility] (NCIRD), Division of Viral Diseases This information is not intended to replace advice given to you by your health care provider. Make sure you discuss any questions you have with your health care provider. Document Revised: 08/10/2020 Document Reviewed: 08/10/2020 Elsevier Patient Education  2022 [employer]      If you have been instructed to have an in-person evaluation today at a local Urgent Care facility, please use the link below. It will take you to a list of all of our available [facility], including address, phone number and hours of operation. Please do not delay  care.  [facility]  If you or a family member do not have a primary care provider, use the link below to schedule a visit and establish care. When you choose a [facility] primary care physician or advanced practice provider, you gain a long-term partner in health. Find a Primary Care Provider  Learn more about [facility] in-office and virtual care options: [facility] - Get Care Now

## 2021-05-29 NOTE — Progress Notes (Signed)
Virtual Visit Consent   Xavier Oconnor, you are scheduled for a virtual visit with a [facility] provider today.     Just as with appointments in the office, your consent must be obtained to participate.  Your consent will be active for this visit and any virtual visit you may have with one of our providers in the next 365 days.     If you have a MyChart account, a copy of this consent can be sent to you electronically.  All virtual visits are billed to your insurance company just like a traditional visit in the office.    As this is a virtual visit, video technology does not allow for your provider to perform a traditional examination.  This may limit your provider's ability to fully assess your condition.  If your provider identifies any concerns that need to be evaluated in person or the need to arrange testing (such as labs, EKG, etc.), we will make arrangements to do so.     Although advances in technology are sophisticated, we cannot ensure that it will always work on either your end or our end.  If the connection with a video visit is poor, the visit may have to be switched to a telephone visit.  With either a video or telephone visit, we are not always able to ensure that we have a secure connection.     I need to obtain your verbal consent now.   Are you willing to proceed with your visit today?    Xavier Oconnor has provided verbal consent on 05/29/2021 for a virtual visit (video or telephone).   Xavier Oconnor, [place]   Date: 05/29/2021 4:55 PM   Virtual Visit via Video Note   I, Xavier Oconnor, connected with  Xavier Oconnor  ([id number], [birth year]) on 05/29/21 at  4:45 PM EST by a video-enabled telemedicine application and verified that I am speaking with the correct person using two identifiers.  Location: Patient: Virtual Visit Location Patient: Home Provider: Virtual Visit Location Provider: Home Office   I discussed the limitations of evaluation and management  by telemedicine and the availability of in person appointments. The patient expressed understanding and agreed to proceed.    History of Present Illness: Clemon T Oconnor is a 41 y.o. who identifies as a male who was assigned male at birth, and is being seen today for COVID-19. Endorses symptoms starting Sunday PM after dinner with some mild lightheadedness, fatigue, anorexia. Overnight into Monday noted increased fatigue with nasal congestion. Unsure of fever. Worsening dry cough and nasal drainage. Tested positive for COVID on Sunday.   Also noting a mild rash of face, around cheeks and eyes. Thinks possibly due to sweating overnight. He gets this from time to time when under the weather. Denies any vision changes. Notes some lower lid swelling with rash. Rash is pruritic but not painful. Denies change to soaps, lotions or detergents.   HPI: HPI  Problems:  Patient Active Problem List   Diagnosis Date Noted   Premature ejaculation 07/16/2019   Herniated lumbar disc without myelopathy 07/13/2018   Obesity (BMI 30-39.9) 10/04/2016   Hyperhidrosis 10/04/2016   Umbilical hernia 10/04/2016   Allergic rhinitis 08/03/2012    Allergies: No Known Allergies Medications:  Current Outpatient Medications:    benzonatate (TESSALON) 100 MG capsule, Take 1 capsule (100 mg total) by mouth 3 (three) times daily as needed for cough., Disp: 30 capsule, Rfl: 0   cetirizine (ZYRTEC) 10 MG  tablet, Take by mouth., Disp: , Rfl:    fluticasone (FLONASE) 50 MCG/ACT nasal spray, USE 1 SPRAY IN EACH NOSTRIL EVERY DAY, Disp: , Rfl:    sildenafil (VIAGRA) 100 MG tablet, Take 0.5-1 tablets (50-100 mg total) by mouth daily as needed for erectile dysfunction., Disp: 15 tablet, Rfl: 11  Observations/Objective: Patient is well-developed, well-nourished in no acute distress.  Resting comfortably at home.  Head is normocephalic, atraumatic.  No labored breathing. Speech is clear and coherent with logical content.   Patient is alert and oriented at baseline.     Media Information   Vesicular rash with lesions adjacent to L upper lip, around R nasolabial fold and infraorbital region. Has had prior cold sores and has had extend up underneath the eye. Typically has not treated int he past.   Assessment and Plan: 1. COVID-19 - MyChart COVID-19 home monitoring program; Future - benzonatate (TESSALON) 100 MG capsule; Take 1 capsule (100 mg total) by mouth 3 (three) times daily as needed for cough.  Dispense: 30 capsule; Refill: 0  Milder symptoms. Lower risk of complications - risk score of 1. No indication for antiviral at present time as risk of ADR from medication > likely benefit. Supportive measures, OTC medications and Vitamin regimen reviewed. Tessalon per orders. Patient enrolled in COVID monitoring program through MyChart. Strict ER precautions reviewed.   2. Rash  Pictures sent to MyChart. Upon further questioning, patient with history of cold sores and notes sometimes extend further up the face. Concern for herpes simplex. Will start antiviral therapy. Strict ER precautions reviewed giving location of rash. Do not feel this is shingles giving how frequently he gets in the same area and typically with lip involvement and sometimes just lip involvement.   Follow Up Instructions: I discussed the assessment and treatment plan with the patient. The patient was provided an opportunity to ask questions and all were answered. The patient agreed with the plan and demonstrated an understanding of the instructions.  A copy of instructions were sent to the patient via MyChart unless otherwise noted below.   The patient was advised to call back or seek an in-person evaluation if the symptoms worsen or if the condition fails to improve as anticipated.  Time:  I spent 10 minutes with the patient via telehealth technology discussing the above problems/concerns.    Xavier Climes, PA-C

## 2021-05-30 MED ORDER — PROMETHAZINE-DM 6.25-15 MG/5ML PO SYRP
5.0000 mL | ORAL_SOLUTION | Freq: Four times a day (QID) | ORAL | 0 refills | Status: DC | PRN
Start: 2021-05-30 — End: 2021-08-17

## 2021-08-10 ENCOUNTER — Ambulatory Visit: Payer: Medicaid Other | Admitting: Family Medicine

## 2021-08-17 ENCOUNTER — Ambulatory Visit (INDEPENDENT_AMBULATORY_CARE_PROVIDER_SITE_OTHER): Payer: Medicaid Other | Admitting: Family Medicine

## 2021-08-17 VITALS — BP 110/77 | HR 68 | Temp 97.5°F | Ht 67.0 in | Wt 216.8 lb

## 2021-08-17 DIAGNOSIS — F524 Premature ejaculation: Secondary | ICD-10-CM

## 2021-08-17 DIAGNOSIS — R809 Proteinuria, unspecified: Secondary | ICD-10-CM | POA: Diagnosis not present

## 2021-08-17 DIAGNOSIS — J309 Allergic rhinitis, unspecified: Secondary | ICD-10-CM | POA: Diagnosis not present

## 2021-08-17 LAB — POCT URINALYSIS DIPSTICK
Bilirubin, UA: NEGATIVE
Blood, UA: NEGATIVE
Glucose, UA: NEGATIVE
Ketones, UA: NEGATIVE
Leukocytes, UA: NEGATIVE
Nitrite, UA: NEGATIVE
Protein, UA: POSITIVE — AB
Spec Grav, UA: 1.025 (ref 1.010–1.025)
Urobilinogen, UA: 0.2 E.U./dL
pH, UA: 6 (ref 5.0–8.0)

## 2021-08-17 MED ORDER — CETIRIZINE HCL 10 MG PO TABS: 10.0000 mg | ORAL_TABLET | Freq: Every day | ORAL | 3 refills | Status: DC

## 2021-08-17 MED ORDER — SILDENAFIL CITRATE 100 MG PO TABS: 50.0000 mg | ORAL_TABLET | Freq: Every day | ORAL | 11 refills | Status: DC | PRN

## 2021-08-17 MED ORDER — FLUTICASONE PROPIONATE 50 MCG/ACT NA SUSP: NASAL | 1 refills | Status: DC

## 2021-08-17 NOTE — Progress Notes (Signed)
? ?  Xavier Oconnor is a 41 y.o. male who presents today for an office visit. ? ?Assessment/Plan:  ?New/Acute Problems: ?Proteinuria ?No red flags.  We will check send out.urinalysis.  Also check CBC, c-Met, TSH, and PSA. If primary is persistent will need referral to nephrology. ? ?Chronic Problems Addressed Today: ?Allergic rhinitis ?Zyrtec and Flonase refilled ? ?Premature ejaculation ?Viagra refilled. ? ? ?  ?Subjective:  ?HPI: ? ?Patient here for follow-up on proteinuria.  He had DOT physical recently was told he had protein in his urine that he should follow-up with his PCP.  He has never had this in the past.  No hematuria.  No dysuria.  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  He has been adjusting his belt waistband which seems to help with this.  No lower extremity swelling.  No fevers or chills.  No recent illnesses or other medication changes. ? ?   ?  ?Objective:  ?Physical Exam: ?BP 110/77 (BP Location: Left Arm)   Pulse 68   Temp (!) 97.5 ?F (36.4 ?C) (Temporal)   Ht _0  (1.702 m)   Wt 216 lb 12.8 oz (98.3 kg)   SpO2 97%   BMI 33.96 kg/m?   ?Gen: No acute distress, resting comfortablyCV: Regular rate and rhythm with no murmurs appreciated ?Pulm: Normal work of breathing, clear to auscultation bilaterally with no crackles, wheezes, or rhonchi ?Neuro: Grossly normal, moves all extremities ?Psych: Normal affect and thought content ? ?   ? ?[name]. Jerline Pain, MD ?08/17/2021 3:40 PM  ?

## 2021-08-17 NOTE — Patient Instructions (Signed)
It was very nice to see you today! ? ?We will check blood work and a urine sample to look for any causes of your urine. ? ?I will refill your medications to [facility]. ? ?Take care, ?Dr Jimmey Ralph ? ?PLEASE NOTE: ? ?If you had any lab tests please let us know if you have not heard back within a few days. You may see your results on mychart before we have a chance to review them but we will give you a call once they are reviewed by [place]. If we ordered any referrals today, please let us know if you have not heard from their office within the next week.  ? ?Please try these tips to maintain a healthy lifestyle: ? ?Eat at least 3 REAL meals and 1-2 snacks per day.  Aim for no more than 5 hours between eating.  If you eat breakfast, please do so within one hour of getting up.  ? ?Each meal should contain half fruits/vegetables, one quarter protein, and one quarter carbs (no bigger than a computer mouse) ? ?Cut down on sweet beverages. This includes juice, soda, and sweet tea.  ? ?Drink at least 1 glass of water with each meal and aim for at least 8 glasses per day ? ?Exercise at least 150 minutes every week.   ?

## 2021-08-18 LAB — CBC
HCT: 44.2 % (ref 38.5–50.0)
Hemoglobin: 14.6 g/dL (ref 13.2–17.1)
MCH: 29.6 pg (ref 27.0–33.0)
MCHC: 33 g/dL (ref 32.0–36.0)
MCV: 89.5 fL (ref 80.0–100.0)
MPV: 10.1 fL (ref 7.5–12.5)
Platelets: 278 10*3/uL (ref 140–400)
RBC: 4.94 10*6/uL (ref 4.20–5.80)
RDW: 13 % (ref 11.0–15.0)
WBC: 4.4 10*3/uL (ref 3.8–10.8)

## 2021-08-18 LAB — URINALYSIS, ROUTINE W REFLEX MICROSCOPIC
Bacteria, UA: NONE SEEN /HPF
Bilirubin Urine: NEGATIVE
Glucose, UA: NEGATIVE
Hgb urine dipstick: NEGATIVE
Hyaline Cast: NONE SEEN /LPF
Ketones, ur: NEGATIVE
Leukocytes,Ua: NEGATIVE
Nitrite: NEGATIVE
RBC / HPF: NONE SEEN /HPF (ref 0–2)
Specific Gravity, Urine: 1.028 (ref 1.001–1.035)
Squamous Epithelial / HPF: NONE SEEN /HPF (ref <=5)
WBC, UA: NONE SEEN /HPF (ref 0–5)
pH: 6.5 (ref 5.0–8.0)

## 2021-08-18 LAB — COMPREHENSIVE METABOLIC PANEL
AG Ratio: 1.7 (calc) (ref 1.0–2.5)
ALT: 20 U/L (ref 9–46)
AST: 14 U/L (ref 10–40)
Albumin: 4.5 g/dL (ref 3.6–5.1)
Alkaline phosphatase (APISO): 84 U/L (ref 36–130)
BUN: 13 mg/dL (ref 7–25)
CO2: 24 mmol/L (ref 20–32)
Calcium: 9.2 mg/dL (ref 8.6–10.3)
Chloride: 106 mmol/L (ref 98–110)
Creat: 0.94 mg/dL (ref 0.60–1.29)
Globulin: 2.7 g/dL (calc) (ref 1.9–3.7)
Glucose, Bld: 71 mg/dL (ref 65–99)
Potassium: 4.4 mmol/L (ref 3.5–5.3)
Sodium: 139 mmol/L (ref 135–146)
Total Bilirubin: 0.7 mg/dL (ref 0.2–1.2)
Total Protein: 7.2 g/dL (ref 6.1–8.1)

## 2021-08-18 LAB — TSH: TSH: 1.63 mIU/L (ref 0.40–4.50)

## 2021-08-18 LAB — MICROSCOPIC MESSAGE

## 2021-08-18 LAB — PSA: PSA: 0.42 ng/mL (ref <=4.00)

## 2021-08-20 ENCOUNTER — Ambulatory Visit: Payer: Medicaid Other | Admitting: Family Medicine

## 2021-08-21 ENCOUNTER — Other Ambulatory Visit: Payer: Self-pay | Admitting: *Deleted

## 2021-08-21 ENCOUNTER — Ambulatory Visit: Payer: Medicaid Other | Admitting: Family Medicine

## 2021-08-21 DIAGNOSIS — R809 Proteinuria, unspecified: Secondary | ICD-10-CM

## 2021-08-21 NOTE — Progress Notes (Signed)
Please inform patient of the following: ? ?He still has a trace amount of protein in his urine but the rest of his blood work is all normal.  Can we have him come back in a week to recheck a send out urinalysis to make sure that it is clearing?  Please place future order and have him come back for lab next week.

## 2021-08-23 ENCOUNTER — Ambulatory Visit: Payer: Medicaid Other | Admitting: Family Medicine

## 2021-08-24 ENCOUNTER — Other Ambulatory Visit (INDEPENDENT_AMBULATORY_CARE_PROVIDER_SITE_OTHER): Payer: Medicaid Other

## 2021-08-24 DIAGNOSIS — R809 Proteinuria, unspecified: Secondary | ICD-10-CM | POA: Diagnosis not present

## 2021-08-24 LAB — URINALYSIS
Bilirubin Urine: NEGATIVE
Hgb urine dipstick: NEGATIVE
Ketones, ur: NEGATIVE
Leukocytes,Ua: NEGATIVE
Nitrite: NEGATIVE
Specific Gravity, Urine: 1.025 (ref 1.000–1.030)
Total Protein, Urine: NEGATIVE
Urine Glucose: NEGATIVE
Urobilinogen, UA: 0.2 (ref 0.0–1.0)
pH: 6.5 (ref 5.0–8.0)

## 2021-08-24 NOTE — Progress Notes (Signed)
Please inform patient of the following:  Good news! His urine test shows no protein. We do not need to do any further testing at this point.  Katina Degree. Jimmey Ralph, MD 08/24/2021 3:40 PM

## 2022-01-18 ENCOUNTER — Encounter: Payer: Medicaid Other | Admitting: Family Medicine

## 2022-03-04 ENCOUNTER — Encounter: Payer: Self-pay | Admitting: Family Medicine

## 2022-03-04 ENCOUNTER — Ambulatory Visit (INDEPENDENT_AMBULATORY_CARE_PROVIDER_SITE_OTHER): Payer: Medicaid Other | Admitting: Family Medicine

## 2022-03-04 VITALS — BP 103/70 | HR 79 | Temp 97.1°F | Ht 67.0 in | Wt 214.6 lb

## 2022-03-04 DIAGNOSIS — J309 Allergic rhinitis, unspecified: Secondary | ICD-10-CM | POA: Diagnosis not present

## 2022-03-04 DIAGNOSIS — R2231 Localized swelling, mass and lump, right upper limb: Secondary | ICD-10-CM

## 2022-03-04 DIAGNOSIS — H109 Unspecified conjunctivitis: Secondary | ICD-10-CM

## 2022-03-04 MED ORDER — OLOPATADINE HCL 0.2 % OP SOLN: OPHTHALMIC | 0 refills | Status: DC

## 2022-03-04 NOTE — Progress Notes (Signed)
   Xavier Oconnor is a 41 y.o. male who presents today for an office visit.  Assessment/Plan:  New/Acute Problems: Conjunctivitis No red flags.  No signs of bacterial infection.  Likely allergic.  We will start Pataday drops.  He can also continue his Zyrtec.  We discussed reasons to return to care.  Palm Nodule May be developing trigger finger though not currently having any symptoms.  We will continue with watchful waiting and he can discuss with his PCP at next office visit.  Chronic Problems Addressed Today: Allergic rhinitis Contributing to above conjunctivitis.  He can continue Claritin and Zyrtec.    Subjective:  HPI:  See A/p for status of chronic conditions.   Over the last couple of weeks he has noticed more blurred vision. This comes and goes. Felt some irritation yesterday as well. Had very blurred vision in left eye this morning. Some redness. A little discharge this morning. Vision is clear is now.  No fevers or chills.  He also has noticed a nodule in his right palm for the last several weeks.  Works as a Naval [employer].  Sometimes has stiffness in his hands.        Objective:  Physical Exam: BP 103/70   Pulse 79   Temp (!) 97.1 F (36.2 C) (Temporal)   Ht 5\' 7"  (1.702 m)   Wt 214 lb 9.6 oz (97.3 kg)   SpO2 98%   BMI 33.61 kg/m   Gen: No acute distress, resting comfortably HEENT: Bilateral mild conjunctival erythema with small amount of watery discharge noted.  No purulent discharge.  Extraocular eye movements intact without pain. Neuro: Grossly normal, moves all extremities MSK: Small nodule noted on flexor tendon of right third digit of hand.  Psych: Normal affect and thought content      Xavier Oconnor M. , MD 03/04/2022 11:39 AM

## 2022-03-04 NOTE — Patient Instructions (Signed)
It was very nice to see you today!  You have no signs of infection in your eye.  You may be having small amount of allergies.  Please try the Pataday drops.  Let us know if not improving.  Take care, Dr Jimmey Ralph  PLEASE NOTE:  If you had any lab tests please let us know if you have not heard back within a few days. You may see your results on mychart before we have a chance to review them but we will give you a call once they are reviewed by [place]. If we ordered any referrals today, please let us know if you have not heard from their office within the next week.   Please try these tips to maintain a healthy lifestyle:  Eat at least 3 REAL meals and 1-2 snacks per day.  Aim for no more than 5 hours between eating.  If you eat breakfast, please do so within one hour of getting up.   Each meal should contain half fruits/vegetables, one quarter protein, and one quarter carbs (no bigger than a computer mouse)  Cut down on sweet beverages. This includes juice, soda, and sweet tea.   Drink at least 1 glass of water with each meal and aim for at least 8 glasses per day  Exercise at least 150 minutes every week.

## 2022-03-12 ENCOUNTER — Encounter: Payer: Self-pay | Admitting: Family Medicine

## 2022-03-12 ENCOUNTER — Ambulatory Visit (INDEPENDENT_AMBULATORY_CARE_PROVIDER_SITE_OTHER): Payer: Medicaid Other | Admitting: Family Medicine

## 2022-03-12 VITALS — BP 96/60 | HR 75 | Temp 98.3°F | Ht 67.0 in | Wt 214.6 lb

## 2022-03-12 DIAGNOSIS — Z0000 Encounter for general adult medical examination without abnormal findings: Secondary | ICD-10-CM | POA: Diagnosis not present

## 2022-03-12 DIAGNOSIS — R631 Polydipsia: Secondary | ICD-10-CM | POA: Diagnosis not present

## 2022-03-12 DIAGNOSIS — H1013 Acute atopic conjunctivitis, bilateral: Secondary | ICD-10-CM | POA: Diagnosis not present

## 2022-03-12 DIAGNOSIS — J301 Allergic rhinitis due to pollen: Secondary | ICD-10-CM | POA: Diagnosis not present

## 2022-03-12 LAB — LIPID PANEL
Cholesterol: 206 mg/dL — ABNORMAL HIGH (ref 0–200)
HDL: 48.9 mg/dL (ref >39.00)
LDL Cholesterol: 138 mg/dL — ABNORMAL HIGH (ref 0–99)
NonHDL: 157.26
Total CHOL/HDL Ratio: 4
Triglycerides: 95 mg/dL (ref 0.0–149.0)
VLDL: 19 mg/dL (ref 0.0–40.0)

## 2022-03-12 LAB — CBC WITH DIFFERENTIAL/PLATELET
Basophils Absolute: 0 10*3/uL (ref 0.0–0.1)
Basophils Relative: 1.1 % (ref 0.0–3.0)
Eosinophils Absolute: 0.3 10*3/uL (ref 0.0–0.7)
Eosinophils Relative: 7.8 % — ABNORMAL HIGH (ref 0.0–5.0)
HCT: 41.4 % (ref 39.0–52.0)
Hemoglobin: 14.3 g/dL (ref 13.0–17.0)
Lymphocytes Relative: 42.9 % (ref 12.0–46.0)
Lymphs Abs: 1.7 10*3/uL (ref 0.7–4.0)
MCHC: 34.5 g/dL (ref 30.0–36.0)
MCV: 87.5 fl (ref 78.0–100.0)
Monocytes Absolute: 0.4 10*3/uL (ref 0.1–1.0)
Monocytes Relative: 11.2 % (ref 3.0–12.0)
Neutro Abs: 1.5 10*3/uL (ref 1.4–7.7)
Neutrophils Relative %: 37 % — ABNORMAL LOW (ref 43.0–77.0)
Platelets: 274 10*3/uL (ref 150.0–400.0)
RBC: 4.73 Mil/uL (ref 4.22–5.81)
RDW: 12.9 % (ref 11.5–15.5)
WBC: 4 10*3/uL (ref 4.0–10.5)

## 2022-03-12 LAB — COMPREHENSIVE METABOLIC PANEL
ALT: 24 U/L (ref 0–53)
AST: 15 U/L (ref 0–37)
Albumin: 4.5 g/dL (ref 3.5–5.2)
Alkaline Phosphatase: 90 U/L (ref 39–117)
BUN: 13 mg/dL (ref 6–23)
CO2: 29 mEq/L (ref 19–32)
Calcium: 9.2 mg/dL (ref 8.4–10.5)
Chloride: 106 mEq/L (ref 96–112)
Creatinine, Ser: 0.97 mg/dL (ref 0.40–1.50)
GFR: 95.54 mL/min (ref >60.00)
Glucose, Bld: 80 mg/dL (ref 70–99)
Potassium: 4.1 mEq/L (ref 3.5–5.1)
Sodium: 140 mEq/L (ref 135–145)
Total Bilirubin: 0.6 mg/dL (ref 0.2–1.2)
Total Protein: 7.2 g/dL (ref 6.0–8.3)

## 2022-03-12 LAB — HEMOGLOBIN A1C: Hgb A1c MFr Bld: 5.4 % (ref 4.6–6.5)

## 2022-03-12 NOTE — Patient Instructions (Signed)
Please return in 12 months for your annual complete physical; please come fasting.  ? ?I will release your lab results to you on your MyChart account with further instructions. You may see the results before I do, but when I review them I will send you a message with my report or have my assistant call you if things need to be discussed. Please reply to my message with any questions. Thank you!  ? ?If you have any questions or concerns, please don't hesitate to send me a message via MyChart or call the office at [phone]. Thank you for visiting with us today! It's our pleasure caring for you.  ? ?Please do these things to maintain good health! ? ?Exercise at least 30-45 minutes a day,  4-5 days a week.  ?Eat a low-fat diet with lots of fruits and vegetables, up to 7-9 servings per day. ?Drink plenty of water daily. Try to drink 8 8oz glasses per day. ?Seatbelts can save your life. Always wear your seatbelt. ?Place Smoke Detectors on every level of your home and check batteries every year. ?Eye Doctor - have an eye exam every 1-2 years ?Safe sex - use condoms to protect yourself from STDs if you could be exposed to these types of infections. ?Avoid heavy alcohol use. If you drink, keep it to less than 2 drinks/day and not every day. ?Health Care Power of Attorney.  Choose someone you trust that could speak for you if you became unable to speak for yourself. ?Depression is common in our stressful world.If you're feeling down or losing interest in things you normally enjoy, please come in for a visit.  ?

## 2022-03-12 NOTE — Progress Notes (Signed)
Subjective  Chief Complaint  Patient presents with   Annual Exam    Pt here for Annual exam and is not currently fasting    HPI: Xavier Oconnor is a 41 y.o. male who presents to [facility] [place] today for a Male Wellness Visit.   Wellness Visit: annual visit with health maintenance review and exam   HM: declines vaccinations. Doing well.  Reviewed notes from allergic conjunctivitis visit recently. Now with some swelling and irritation beneath right eyelid. Using pataday and zyrtec.  ROS + polydipsia and intermittent blurred vision  Body mass index is 33.61 kg/m. Wt Readings from Last 3 Encounters:  03/12/22 214 lb 9.6 oz (97.3 kg)  03/04/22 214 lb 9.6 oz (97.3 kg)  08/17/21 216 lb 12.8 oz (98.3 kg)    Patient Active Problem List   Diagnosis Date Noted   Herniated lumbar disc without myelopathy 07/13/2018   Obesity (BMI 30-39.9) 10/04/2016   Hyperhidrosis 10/04/2016   Umbilical hernia 10/04/2016   Allergic rhinitis 08/03/2012   Health Maintenance  Topic Date Due   DTaP/Tdap/Td (2 - Td or Tdap) 06/28/2027   Hepatitis C Screening  Completed   HIV Screening  Completed   HPV VACCINES  Aged Out   INFLUENZA VACCINE  Discontinued   COVID-19 Vaccine  Discontinued   Immunization History  Administered Date(s) Administered   Tdap 06/27/2017   We updated and reviewed the patient's past history in detail and it is documented below. Allergies: Patient has No Known Allergies. Past Medical History  has a past medical history of Allergy and Premature ejaculation (07/16/2019). Past Surgical History  has no past surgical history on file. Social History Patient  reports that he has never smoked. He has never used smokeless tobacco. He reports current alcohol use of about 2.0 standard drinks of alcohol per week. He reports that he does not use drugs. Family History Patient family history includes Breast cancer in his sister; Healthy in his brother, daughter,  father, and mother. Review of Systems: Constitutional: negative for fever or malaise Ophthalmic: negative for photophobia, double vision or loss of vision Cardiovascular: negative for chest pain, dyspnea on exertion, or new LE swelling Respiratory: negative for SOB or persistent cough Gastrointestinal: negative for abdominal pain, change in bowel habits or melena Genitourinary: negative for dysuria or gross hematuria Musculoskeletal: negative for new gait disturbance or muscular weakness Integumentary: negative for new or persistent rashes, no breast lumps Neurological: negative for TIA or stroke symptoms Psychiatric: negative for SI or delusions Allergic/Immunologic: negative for hives  Patient Care Team    Relationship Specialty Notifications Start End  Willow Ora, MD PCP - General Family Medicine  07/16/19   Ortho, [facility]  05/22/18    Objective  Vitals: BP 96/60   Pulse 75   Temp 98.3 F (36.8 C)   Ht 5\' 7"  (1.702 m)   Wt 214 lb 9.6 oz (97.3 kg)   SpO2 95%   BMI 33.61 kg/m  General:  Well developed, well nourished, no acute distress  Psych:  Alert and orientedx3,normal mood and affect HEENT:  Normocephalic, atraumatic, non-icteric sclera, PERRL, oropharynx is clear without mass or exudate, supple neck without adenopathy, mass or thyromegaly Cardiovascular:  Normal S1, S2, RRR without gallop, rub or murmur, nondisplaced PMI, +2 distal pulses in bilateral upper and lower extremities. Respiratory:  Good breath sounds bilaterally, CTAB with normal respiratory effort Gastrointestinal: normal bowel sounds, soft, non-tender, no noted masses. No HSM MSK:  no deformities, contusions. Joints are without erythema or swelling. Spine and CVA region are nontender Skin:  Warm, no rashes or suspicious lesions noted, lower eye lids with erythema, swelling and dryness Neurologic:    Mental status is normal. CN 2-11 are normal. Gross motor and sensory exams are  normal. Stable gait. No tremor GU: No inguinal hernias or adenopathy are appreciated bilaterally  Assessment  1. Annual physical exam   2. Seasonal allergic rhinitis due to pollen   3. Allergic conjunctivitis of both eyes   4. Polydipsia      Plan  Male Wellness Visit: Age appropriate Health Maintenance and Prevention measures were discussed with patient. Included topics are cancer screening recommendations, ways to keep healthy (see AVS) including dietary and exercise recommendations, regular eye and dental care, use of seat belts, and avoidance of moderate alcohol use and tobacco use.  BMI: discussed patient's BMI and encouraged positive lifestyle modifications to help get to or maintain a target BMI. HM needs and immunizations were addressed and ordered. See below for orders. See HM and immunization section for updates. Declines vaccines Routine labs and screening tests ordered including cmp, cbc and lipids where appropriate. Discussed recommendations regarding Vit D and calcium supplementation (see AVS) Screen for diabetes due to polydipsia and blurred vision Treat allergic conjunctivitis  Follow up: 12 mo for cpe  Commons side effects, risks, benefits, and alternatives for medications and treatment plan prescribed today were discussed, and the patient expressed understanding of the given instructions. Patient is instructed to call or message via MyChart if he/she has any questions or concerns regarding our treatment plan. No barriers to understanding were identified. We discussed Red Flag symptoms and signs in detail. Patient expressed understanding regarding what to do in case of urgent or emergency type symptoms.  Medication list was reconciled, printed and provided to the patient in AVS. Patient instructions and summary information was reviewed with the patient as documented in the AVS. This note was prepared with assistance of Dragon voice recognition software. Occasional wrong-word  or sound-a-like substitutions may have occurred due to the inherent limitations of voice recognition software   Orders Placed This Encounter  Procedures   CBC with Differential/Platelet   Comprehensive metabolic panel   Lipid panel   Hemoglobin A1c   No orders of the defined types were placed in this encounter.

## 2022-08-06 ENCOUNTER — Telehealth: Payer: Self-pay | Admitting: Family Medicine

## 2022-08-06 ENCOUNTER — Ambulatory Visit (INDEPENDENT_AMBULATORY_CARE_PROVIDER_SITE_OTHER): Payer: Medicaid Other | Admitting: Family Medicine

## 2022-08-06 ENCOUNTER — Encounter: Payer: Self-pay | Admitting: Family Medicine

## 2022-08-06 VITALS — BP 96/66 | HR 77 | Temp 98.4°F | Ht 67.0 in | Wt 215.6 lb

## 2022-08-06 DIAGNOSIS — M25561 Pain in right knee: Secondary | ICD-10-CM | POA: Diagnosis not present

## 2022-08-06 DIAGNOSIS — J301 Allergic rhinitis due to pollen: Secondary | ICD-10-CM | POA: Diagnosis not present

## 2022-08-06 MED ORDER — CETIRIZINE HCL 10 MG PO TABS: 10.0000 mg | ORAL_TABLET | Freq: Every day | ORAL | 3 refills | Status: DC

## 2022-08-06 MED ORDER — FLUTICASONE PROPIONATE 50 MCG/ACT NA SUSP: NASAL | 11 refills | Status: DC

## 2022-08-06 MED ORDER — DICLOFENAC SODIUM 75 MG PO TBEC: 75.0000 mg | DELAYED_RELEASE_TABLET | Freq: Two times a day (BID) | ORAL | 0 refills | Status: DC

## 2022-08-06 NOTE — Patient Instructions (Signed)
Please follow up if symptoms do not improve or as needed.    Please go to our [facility] office to get your xrays done. You can walk in M-F between 8:30am- noon or 1pm - 5pm. Tell them you are there for xrays ordered by me. They will send me the results, then I will let you know the results with instructions.   Address: [address].  The Xray department is located in the basement.    Acute Knee Pain, Adult Acute knee pain is sudden and may be caused by damage, swelling, or irritation of the muscles and tissues that support the knee. Pain may result from: A fall. An injury to the knee from twisting motions. A hit to the knee. Infection. Acute knee pain may go away on its own with time and rest. If it does not, your health care provider may order tests to find the cause of the pain. These may include: Imaging tests, such as an X-ray, MRI, CT scan, or ultrasound. Joint aspiration. In this test, fluid is removed from the knee and evaluated. Arthroscopy. In this test, a lighted tube is inserted into the knee and an image is projected onto a TV screen. Biopsy. In this test, a sample of tissue is removed from the body and studied under a microscope. Follow these instructions at home: If you have a knee sleeve or brace:  Wear the knee sleeve or brace as told by your health care provider. Remove it only as told by your health care provider. Loosen it if your toes tingle, become numb, or turn cold and blue. Keep it clean. If the knee sleeve or brace is not waterproof: Do not let it get wet. Cover it with a watertight covering when you take a bath or shower. Activity Rest your knee. Do not do things that cause pain or make pain worse. Avoid high-impact activities or exercises, such as running, jumping rope, or doing jumping jacks. Work with a physical therapist to make a safe exercise program, as recommended by your health care provider. Do exercises as told by your physical  therapist. Managing pain, stiffness, and swelling  If directed, put ice on the affected knee. To do this: If you have a removable knee sleeve or brace, remove it as told by your health care provider. Put ice in a plastic bag. Place a towel between your skin and the bag. Leave the ice on for 20 minutes, 2-3 times a day. Remove the ice if your skin turns bright red. This is very important. If you cannot feel pain, heat, or cold, you have a greater risk of damage to the area. If directed, use an elastic bandage to put pressure (compression) on your injured knee. This may control swelling, give support, and help with discomfort. Raise (elevate) your knee above the level of your heart while you are sitting or lying down. Sleep with a pillow under your knee. General instructions Take over-the-counter and prescription medicines only as told by your health care provider. Do not use any products that contain nicotine or tobacco, such as cigarettes, e-cigarettes, and chewing tobacco. If you need help quitting, ask your health care provider. If you are overweight, work with your health care provider and a dietitian to set a weight-loss goal that is healthy and reasonable for you. Extra weight can put pressure on your knee. Pay attention to any changes in your symptoms. Keep all follow-up visits. This is important. Contact a health care  provider if: Your knee pain continues, changes, or gets worse. You have a fever along with knee pain. Your knee feels warm to the touch or is red. Your knee buckles or locks up. Get help right away if: Your knee swells, and the swelling becomes worse. You cannot move your knee. You have severe pain in your knee that cannot be managed with pain medicine. Summary Acute knee pain can be caused by a fall, an injury, an infection, or damage, swelling, or irritation of the tissues that support your knee. Your health care provider may perform tests to find out the cause of  the pain. Pay attention to any changes in your symptoms. Relieve your pain with rest, medicines, light activity, and the use of ice. Get help right away if your knee swells, you cannot move your knee, or you have severe pain that cannot be managed with medicine. This information is not intended to replace advice given to you by your health care provider. Make sure you discuss any questions you have with your health care provider. Document Revised: 09/08/2019 Document Reviewed: 09/08/2019 Elsevier Patient Education  2023 [employer].

## 2022-08-06 NOTE — Progress Notes (Signed)
Subjective  CC:  Chief Complaint  Patient presents with   Knee Pain    Pt stated that he noticed some pain in his Rt knee over the past 2 weeks which began in the knee and then moved up to the upper thigh.     HPI: Xavier Oconnor is a 42 y.o. male who presents to the office today to address the problems listed above in the chief complaint. 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  He thought it was related to positioning.  However throbbing pain persist.  He is also been moving, lifting lots of boxes and going up and down stairs etc., knee pain now radiating to upper thigh.  No swelling in the calf.  No swelling in the knee.  No injury.  No prior knee problems.  No locking or giving way.  Limping at times.  Has not taken any medications for it. Seasonal allergies: He has been using Zyrtec and Flonase.  Pataday as needed.  Symptoms have been much better controlled.  He needs refills. Review of systems: Negative for bowel or bladder incontinence, radicular pain, fevers Assessment  1. Acute pain of right knee   2. Seasonal allergic rhinitis due to pollen      Plan  Acute knee pain: Right pain, check x-rays for osteoarthritic findings.  Differential includes osteoarthritis, medial meniscus derangement, plica syndrome, other.  Recommend knee sleeve, ice, Voltaren 75 twice daily for 2 weeks.  Recheck if not improving. Seasonal allergies are well-controlled.  Refilled Flonase and Zyrtec  Follow up: As needed Visit date not found  Orders Placed This Encounter  Procedures   DG Knee Complete 4 Views Right   Meds ordered this encounter  Medications   cetirizine (ZYRTEC) 10 MG tablet    Sig: Take 1 tablet (10 mg total) by mouth daily.    Dispense:  90 tablet    Refill:  3   fluticasone (FLONASE) 50 MCG/ACT nasal spray    Sig: USE 1 SPRAY IN EACH NOSTRIL EVERY DAY    Dispense:  16 g    Refill:  11   diclofenac (VOLTAREN) 75 MG EC  tablet    Sig: Take 1 tablet (75 mg total) by mouth 2 (two) times daily.    Dispense:  30 tablet    Refill:  0      I reviewed the patients updated PMH, FH, and SocHx.    Patient Active Problem List   Diagnosis Date Noted   Herniated lumbar disc without myelopathy 07/13/2018   Obesity (BMI 30-39.9) 10/04/2016   Hyperhidrosis 10/04/2016   Umbilical hernia 10/04/2016   Allergic rhinitis 08/03/2012   Current Meds  Medication Sig   diclofenac (VOLTAREN) 75 MG EC tablet Take 1 tablet (75 mg total) by mouth 2 (two) times daily.   Olopatadine HCl (PATADAY) 0.2 % SOLN Apply 1 drop to each eye once daily.   [DISCONTINUED] cetirizine (ZYRTEC) 10 MG tablet Take 1 tablet (10 mg total) by mouth daily.   [DISCONTINUED] fluticasone (FLONASE) 50 MCG/ACT nasal spray USE 1 SPRAY IN EACH NOSTRIL EVERY DAY    Allergies: Patient has No Known Allergies. Family History: Patient family history includes Breast cancer in his sister; Healthy in his brother, daughter, father, and mother. Social History:  Patient  reports that he has never smoked. He has never used smokeless tobacco. He reports current alcohol use of about 2.0 standard drinks of alcohol per week. He  reports that he does not use drugs.  Review of Systems: Constitutional: Negative for fever malaise or anorexia Cardiovascular: negative for chest pain Respiratory: negative for SOB or persistent cough Gastrointestinal: negative for abdominal pain  Objective  Vitals: BP 96/66   Pulse 77   Temp 98.4 F (36.9 C)   Ht 5\' 7"  (1.702 m)   Wt 215 lb 9.6 oz (97.8 kg)   SpO2 95%   BMI 33.77 kg/m  General: no acute distress , A&Ox3 Right leg:  positive exam findings: medial joint line tenderness and tenderness noted medial, distal and negative exam findings: no effusion, no erythema, ACL stable, MCL stable, LCL stable, no patellar laxity, McMurray's negative, no crepitus, and FROM   Commons side effects, risks, benefits, and alternatives for  medications and treatment plan prescribed today were discussed, and the patient expressed understanding of the given instructions. Patient is instructed to call or message via MyChart if he/she has any questions or concerns regarding our treatment plan. No barriers to understanding were identified. We discussed Red Flag symptoms and signs in detail. Patient expressed understanding regarding what to do in case of urgent or emergency type symptoms.  Medication list was reconciled, printed and provided to the patient in AVS. Patient instructions and summary information was reviewed with the patient as documented in the AVS. This note was prepared with assistance of Dragon voice recognition software. Occasional wrong-word or sound-a-like substitutions may have occurred due to the inherent limitations of voice recognition software

## 2022-08-06 NOTE — Telephone Encounter (Signed)
Patient is requesting that all prescriptions- cetrizine, diclofenac, and fluticasone be sent to his new primary pharmacy [facility] @ [id number] [place].

## 2022-08-07 ENCOUNTER — Other Ambulatory Visit: Payer: Self-pay

## 2022-08-07 ENCOUNTER — Telehealth: Payer: Self-pay | Admitting: Family Medicine

## 2022-08-07 MED ORDER — FLUTICASONE PROPIONATE 50 MCG/ACT NA SUSP: NASAL | 11 refills | Status: DC

## 2022-08-07 MED ORDER — DICLOFENAC SODIUM 75 MG PO TBEC: 75.0000 mg | DELAYED_RELEASE_TABLET | Freq: Two times a day (BID) | ORAL | 0 refills | Status: DC

## 2022-08-07 MED ORDER — CETIRIZINE HCL 10 MG PO TABS: 10.0000 mg | ORAL_TABLET | Freq: Every day | ORAL | 3 refills | Status: DC

## 2022-08-07 NOTE — Telephone Encounter (Signed)
Rx sent 

## 2022-08-07 NOTE — Telephone Encounter (Signed)
Pt states pharmacy told him  diclofenac (VOLTAREN) 75 MG EC tablet  Needs prior authorization and also, if he can get ibuprofen 800mg  for the pain. Please advise.

## 2022-08-08 ENCOUNTER — Telehealth: Payer: Self-pay

## 2022-08-08 ENCOUNTER — Other Ambulatory Visit (HOSPITAL_COMMUNITY): Payer: Self-pay

## 2022-08-08 NOTE — Telephone Encounter (Signed)
Patient Advocate Encounter  Prior Authorization for Diclofenac Sodium 75MG  dr tablets has been approved with [employer]    PA# [id number] Effective dates: 08/08/22 through 08/08/23  Per [facility] test claim, copay for 15 days supply is $4

## 2022-08-08 NOTE — Telephone Encounter (Signed)
PA submitted via CMM. Created new encounter for PA. Will route back to pool once determination has been made. 

## 2022-08-08 NOTE — Telephone Encounter (Signed)
Pharmacy Patient Advocate Encounter   Received notification from [facility] that prior authorization for Diclofenac Sodium 75MG  dr tablets is required/requested.   PA submitted on 08/08/22 to (ins) [facility] Medicaid via CoverMyMeds Key or [facility]) confirmation # [id number] Status is pending

## 2022-08-09 ENCOUNTER — Ambulatory Visit (INDEPENDENT_AMBULATORY_CARE_PROVIDER_SITE_OTHER)
Admission: RE | Admit: 2022-08-09 | Discharge: 2022-08-09 | Disposition: A | Discharge status: Home / Self Care | Payer: Medicaid Other | Source: Ambulatory Visit | Attending: Family Medicine | Admitting: Family Medicine

## 2022-08-09 DIAGNOSIS — M25561 Pain in right knee: Secondary | ICD-10-CM | POA: Diagnosis not present

## 2022-08-20 ENCOUNTER — Encounter: Payer: Self-pay | Admitting: Family Medicine

## 2022-08-20 DIAGNOSIS — M1710 Unilateral primary osteoarthritis, unspecified knee: Secondary | ICD-10-CM | POA: Insufficient documentation

## 2022-08-20 HISTORY — DX: Unilateral primary osteoarthritis, unspecified knee: M17.10

## 2022-09-16 ENCOUNTER — Ambulatory Visit: Payer: Medicaid Other | Admitting: Family Medicine

## 2022-09-20 ENCOUNTER — Ambulatory Visit (INDEPENDENT_AMBULATORY_CARE_PROVIDER_SITE_OTHER): Payer: Medicaid Other | Admitting: Family Medicine

## 2022-09-20 ENCOUNTER — Encounter: Payer: Self-pay | Admitting: Family Medicine

## 2022-09-20 VITALS — BP 116/60 | HR 73 | Temp 98.0°F | Ht 67.0 in | Wt 220.8 lb

## 2022-09-20 DIAGNOSIS — M1710 Unilateral primary osteoarthritis, unspecified knee: Secondary | ICD-10-CM | POA: Diagnosis not present

## 2022-09-20 DIAGNOSIS — M25561 Pain in right knee: Secondary | ICD-10-CM | POA: Diagnosis not present

## 2022-09-20 MED ORDER — DICLOFENAC SODIUM 1 % EX GEL: 4.0000 g | Freq: Four times a day (QID) | CUTANEOUS | 5 refills | Status: DC | PRN

## 2022-09-20 NOTE — Patient Instructions (Signed)
Please follow up if symptoms do not improve or as needed.    Patellofemoral Pain Syndrome  Patellofemoral pain syndrome is a condition in which the tissue (cartilage) on the underside of the kneecap (patella) softens or breaks down. This causes pain in the front of the knee. The condition is also called runner's knee or chondromalacia patella. Patellofemoral pain syndrome is most common in young adults who are active in sports. The knee is the largest joint in the body. The patella covers the front of the knee and is attached to muscles above and below the knee. The underside of the patella is covered with a smooth type of cartilage (synovium). The smooth surface helps the patella glide easily when you move your knee. Patellofemoral pain syndrome causes swelling in the joint linings and bone surfaces in the knee. What are the causes? This condition may be caused by: Overuse of the knee. Poor alignment of your knee joints. Weak leg muscles. A direct hit to your kneecap. What increases the risk? You are more likely to develop this condition if: You do a lot of activities that can wear down your kneecap. These include: Running. Squatting. Climbing stairs. You start a new physical activity or exercise program. You wear shoes that do not fit well. You do not have good leg strength. You are overweight. What are the signs or symptoms? The main symptom of this condition is knee pain. This may feel like a dull, aching pain underneath your patella, in the front of your knee. There may be a popping or cracking sound when you move your knee. Pain may get worse with: Exercise. Climbing stairs. Running. Jumping. Squatting. Kneeling. Sitting for a long time. Moving or pushing on your patella. How is this diagnosed? This condition may be diagnosed based on: Your symptoms and medical history. You may be asked about your recent physical activities and which ones cause knee pain. A physical exam.  This may include: Moving your patella back and forth. Checking your range of knee motion. Having you squat or jump to see if you have pain. Checking the strength of your leg muscles. Imaging tests to confirm the diagnosis. These may include an MRI of your knee. How is this treated? This condition may be treated at home with rest, ice, compression, and elevation (RICE).  Other treatments may include: NSAIDs, such as ibuprofen. Physical therapy to stretch and strengthen your leg muscles. Shoe inserts (orthotics) to take stress off your knee. A knee brace or knee support. Adhesive tapes to the skin. Surgery to remove damaged cartilage or move the patella to a better position. This is rare. Follow these instructions at home: If you have a brace: Wear the brace as told by your health care provider. Remove it only as told by your health care provider. Loosen the brace if your toes tingle, become numb, or turn cold and blue. Keep the brace clean. If the brace is not waterproof: Do not let it get wet. Cover it with a watertight covering when you take a bath or a shower. Managing pain, stiffness, and swelling  If directed, put ice on the painful area. To do this: If you have a removable brace, remove it as told by your health care provider. Put ice in a plastic bag. Place a towel between your skin and the bag. Leave the ice on for 20 minutes, 2-3 times a day. Remove the ice if your skin turns bright red. This is very important. If you cannot feel  pain, heat, or cold, you have a greater risk of damage to the area. Move your toes often to reduce stiffness and swelling. Raise (elevate) the injured area above the level of your heart while you are sitting or lying down. Activity Rest your knee. Avoid activities that cause knee pain. Perform stretching and strengthening exercises as told by your health care provider or physical therapist. Return to your normal activities as told by your health  care provider. Ask your health care provider what activities are safe for you. General instructions Take over-the-counter and prescription medicines only as told by your health care provider. Use splints, braces, knee supports, or walking aids as directed by your health care provider. Do not use any products that contain nicotine or tobacco, such as cigarettes, e-cigarettes, and chewing tobacco. These can delay healing. If you need help quitting, ask your health care provider. Keep all follow-up visits. This is important. Contact a health care provider if: Your symptoms get worse. You are not improving with home care. Summary Patellofemoral pain syndrome is a condition in which the tissue (cartilage) on the underside of the kneecap (patella) softens or breaks down. This condition causes swelling in the joint linings and bone surfaces in the knee. This leads to pain in the front of the knee. This condition may be treated at home with rest, ice, compression, and elevation (RICE). Use splints, braces, knee supports, or walking aids as directed by your health care provider. This information is not intended to replace advice given to you by your health care provider. Make sure you discuss any questions you have with your health care provider. Document Revised: 09/08/2019 Document Reviewed: 09/08/2019 Elsevier Patient Education  2024 [employer].

## 2022-09-20 NOTE — Progress Notes (Signed)
Subjective  CC:  Chief Complaint  Patient presents with   Knee Pain    Pt stated that he would like a referral to orthro for his knee pain which has not gotten any better    HPI: Xavier Oconnor is a 42 y.o. male who presents to the office today to address the problems listed above in the chief complaint. Right knee pain: Follow-up from April 30 visit.  Patient has improved.  However still with daily symptoms.  Had acute knee pain, more medially and radiated up into her thigh.  Treated for osteoarthritis and/or tendinitis with NSAID, ice and knee sleeve.  X-ray showed mild medial compartment DJD and patellofemoral DJD.  No effusion noted.  Since, he is doing significantly better.  Pain is lessened however describes pain with standing after prolonged sitting.  Sometimes with stairs.  Describes pressure and medial knee pain.  No swelling or warmth.  No locking or give way.  No calf pain  Assessment  1. Acute pain of right knee   2. Osteoarthritis, localized, knee   3. Patellofemoral arthralgia of right knee      Plan  Right knee pain due to DJD and possible patellofemoral syndrome.:  Offered different treatment options.  Nonsurgical at this point.  Recommend ice, topical Voltaren gel 4 times daily and time.  Discussed VMO exercises.  If cannot get improved, could try a steroid injection and/or sports medicine referral.  Patient agrees.  Follow up: As needed Visit date not found  No orders of the defined types were placed in this encounter.  Meds ordered this encounter  Medications   diclofenac Sodium (VOLTAREN) 1 % GEL    Sig: Apply 4 g topically 4 (four) times daily as needed (knee pain).    Dispense:  350 g    Refill:  5      I reviewed the patients updated PMH, FH, and SocHx.    Patient Active Problem List   Diagnosis Date Noted   Osteoarthritis, localized, knee 08/20/2022   Herniated lumbar disc without myelopathy 07/13/2018   Obesity (BMI 30-39.9) 10/04/2016    Hyperhidrosis 10/04/2016   Umbilical hernia 10/04/2016   Allergic rhinitis 08/03/2012   Current Meds  Medication Sig   cetirizine (ZYRTEC) 10 MG tablet Take 1 tablet (10 mg total) by mouth daily.   diclofenac (VOLTAREN) 75 MG EC tablet Take 1 tablet (75 mg total) by mouth 2 (two) times daily.   diclofenac Sodium (VOLTAREN) 1 % GEL Apply 4 g topically 4 (four) times daily as needed (knee pain).   fluticasone (FLONASE) 50 MCG/ACT nasal spray USE 1 SPRAY IN EACH NOSTRIL EVERY DAY   Olopatadine HCl (PATADAY) 0.2 % SOLN Apply 1 drop to each eye once daily.    Allergies: Patient has No Known Allergies. Family History: Patient family history includes Breast cancer in his sister; Healthy in his brother, daughter, father, and mother. Social History:  Patient  reports that he has never smoked. He has never used smokeless tobacco. He reports current alcohol use of about 2.0 standard drinks of alcohol per week. He reports that he does not use drugs.  Review of Systems: Constitutional: Negative for fever malaise or anorexia Cardiovascular: negative for chest pain Respiratory: negative for SOB or persistent cough Gastrointestinal: negative for abdominal pain  Objective  Vitals: BP 116/60   Pulse 73   Temp 98 F (36.7 C)   Ht 5\' 7"  (1.702 m)   Wt 220 lb 12.8 oz (100.2 kg)  SpO2 93%   BMI 34.58 kg/m  General: no acute distress , A&Ox3 Normal gait, no limp Right knee: Normal exam except for mild medial joint line tenderness. IMPRESSION: Right knee x-ray Minimal medial compartment and patellofemoral compartment osteoarthritis. Commons side effects, risks, benefits, and alternatives for medications and treatment plan prescribed today were discussed, and the patient expressed understanding of the given instructions. Patient is instructed to call or message via MyChart if he/she has any questions or concerns regarding our treatment plan. No barriers to understanding were identified. We  discussed Red Flag symptoms and signs in detail. Patient expressed understanding regarding what to do in case of urgent or emergency type symptoms.  Medication list was reconciled, printed and provided to the patient in AVS. Patient instructions and summary information was reviewed with the patient as documented in the AVS. This note was prepared with assistance of Dragon voice recognition software. Occasional wrong-word or sound-a-like substitutions may have occurred due to the inherent limitations of voice recognition software

## 2022-12-13 ENCOUNTER — Ambulatory Visit: Payer: Medicaid Other | Admitting: Family Medicine

## 2022-12-30 ENCOUNTER — Ambulatory Visit (INDEPENDENT_AMBULATORY_CARE_PROVIDER_SITE_OTHER): Payer: Medicaid Other | Admitting: Family Medicine

## 2022-12-30 ENCOUNTER — Encounter: Payer: Self-pay | Admitting: Family Medicine

## 2022-12-30 VITALS — BP 108/70 | HR 68 | Temp 97.2°F | Ht 67.0 in | Wt 219.0 lb

## 2022-12-30 DIAGNOSIS — R21 Rash and other nonspecific skin eruption: Secondary | ICD-10-CM | POA: Diagnosis not present

## 2022-12-30 DIAGNOSIS — J301 Allergic rhinitis due to pollen: Secondary | ICD-10-CM | POA: Diagnosis not present

## 2022-12-30 MED ORDER — FLUTICASONE PROPIONATE 50 MCG/ACT NA SUSP: NASAL | 11 refills | Status: DC

## 2022-12-30 MED ORDER — CETIRIZINE HCL 10 MG PO TABS: 10.0000 mg | ORAL_TABLET | Freq: Every day | ORAL | 3 refills | Status: DC

## 2022-12-30 MED ORDER — TRIAMCINOLONE ACETONIDE 0.5 % EX OINT: 1.0000 | TOPICAL_OINTMENT | Freq: Two times a day (BID) | CUTANEOUS | 0 refills | Status: DC

## 2022-12-30 NOTE — Progress Notes (Signed)
   Xavier Oconnor is a 42 y.o. male who presents today for an office visit.  Assessment/Plan:  Chronic Problems Addressed Today: Seasonal allergies Will refill his Zyrtec and Flonase today.  Rash  Appearance consistent with eczema.  Given his degree of inflammation we will start triamcinolone 0.5% ointment twice daily for the next 7 to 14 days.  Also discussed importance of daily emollient with sensitive skin products.  He will let us know if not improving in the next week or 2.  Consider biopsy versus referral to dermatology at that point.    Subjective:  HPI:  Patient here with rash on right leg. This has been present for at least a year or so. Dry skin and itchy.  Tried over-the-counter lotions without much improvement.  Sometimes burning and stinging.  No other treatments tried.  No obvious precipitating events.  No new soaps, detergents, or lotions.      Objective:  Physical Exam: BP 108/70   Pulse 68   Temp (!) 97.2 F (36.2 C) (Temporal)   Ht 5\' 7"  (1.702 m)   Wt 219 lb (99.3 kg)   SpO2 96%   BMI 34.30 kg/m   Gen: No acute distress, resting comfortably Skin: Dry and erythematous rash with hyperpigmentation on the right lateral lower leg.  Flaking noted. Neuro: Grossly normal, moves all extremities Psych: Normal affect and thought content      Xavier Hosking M. Jimmey Ralph, MD 12/30/2022 8:11 AM

## 2022-12-30 NOTE — Patient Instructions (Signed)
It was very nice to see you today!  I think you have eczema on your legs.  Use the triamcinolone.  Use daily sensitive skin or eczema lotion.  Let us know if not improving.  Return if symptoms worsen or fail to improve.   Take care, Dr Jimmey Ralph  PLEASE NOTE:  If you had any lab tests, please let us know if you have not heard back within a few days. You may see your results on mychart before we have a chance to review them but we will give you a call once they are reviewed by [place].   If we ordered any referrals today, please let us know if you have not heard from their office within the next week.   If you had any urgent prescriptions sent in today, please check with the pharmacy within an hour of our visit to make sure the prescription was transmitted appropriately.   Please try these tips to maintain a healthy lifestyle:  Eat at least 3 REAL meals and 1-2 snacks per day.  Aim for no more than 5 hours between eating.  If you eat breakfast, please do so within one hour of getting up.   Each meal should contain half fruits/vegetables, one quarter protein, and one quarter carbs (no bigger than a computer mouse)  Cut down on sweet beverages. This includes juice, soda, and sweet tea.   Drink at least 1 glass of water with each meal and aim for at least 8 glasses per day  Exercise at least 150 minutes every week.

## 2023-01-27 ENCOUNTER — Ambulatory Visit: Payer: Medicaid Other | Admitting: Family Medicine

## 2023-01-27 ENCOUNTER — Encounter: Payer: Self-pay | Admitting: Family Medicine

## 2023-01-27 ENCOUNTER — Telehealth: Payer: Self-pay | Admitting: Family Medicine

## 2023-01-27 VITALS — BP 118/78 | HR 71 | Temp 98.8°F | Ht 67.0 in | Wt 218.6 lb

## 2023-01-27 DIAGNOSIS — R200 Anesthesia of skin: Secondary | ICD-10-CM | POA: Diagnosis not present

## 2023-01-27 DIAGNOSIS — H539 Unspecified visual disturbance: Secondary | ICD-10-CM | POA: Diagnosis not present

## 2023-01-27 DIAGNOSIS — M542 Cervicalgia: Secondary | ICD-10-CM

## 2023-01-27 DIAGNOSIS — R202 Paresthesia of skin: Secondary | ICD-10-CM

## 2023-01-27 LAB — GLUCOSE, POCT (MANUAL RESULT ENTRY): POC Glucose: 81 mg/dL (ref 70–99)

## 2023-01-27 MED ORDER — MELOXICAM 7.5 MG PO TABS
7.5000 mg | ORAL_TABLET | Freq: Every day | ORAL | 0 refills | Status: DC
Start: 2023-01-27 — End: 2023-04-22

## 2023-01-27 MED ORDER — CYCLOBENZAPRINE HCL 5 MG PO TABS
5.0000 mg | ORAL_TABLET | Freq: Every evening | ORAL | 0 refills | Status: DC | PRN
Start: 2023-01-27 — End: 2023-04-22

## 2023-01-27 NOTE — Telephone Encounter (Signed)
FYI: This call has been transferred to triage nurse: the Triage Nurse. Once the result note has been entered staff can address the message at that time.  Patient called in with the following symptoms:  Red Word: tingling in left arm down to fingertips   Please advise at Mobile [phone] (mobile)  Message is routed to Provider Pool.

## 2023-01-27 NOTE — Telephone Encounter (Signed)
Elizabeth, triage nurse, called informing us that final outcome was for pt to be seen within 4 hours. Patient has been scheduled for 9:40 am with Dr. Neva Seat @ [place].      Patient Name First: [name] Last: [name] Gender: Male DOB: 02-07-1981 Age: 42 Y 6 M Return Phone Number: [phone] (Primary) Address: City/ State/ Zip: [place]  [address] Client [facility] - Administrator, [facility] at [facility] Provider Asencion Partridge- MD Contact Type Call Who Is Calling Patient / Member / Family / Caregiver Call Type Triage / Clinical Relationship To Patient Self Return Phone Number [phone] (Primary) Chief Complaint NUMBNESS/TINGLING- sudden on one side of the body or face Reason for Call Symptomatic / Request for Health Information Initial Comment Caller states he is having tingling in left arm to finger tips Translation No Nurse Assessment Nurse: Michell Heinrich, RN, Lanora Manis Date/Time (Eastern Time): 01/27/2023 8:57:41 AM Confirm and document reason for call. If symptomatic, describe symptoms. ---Caller states tingling in left arm to finger tips. Does the patient have any new or worsening symptoms? ---Yes Will a triage be completed? ---Yes Related visit to physician within the last 2 weeks? ---No Does the PT have any chronic conditions? (i.e. diabetes, asthma, this includes High risk factors for pregnancy, etc.) ---No Is the patient pregnant or possibly pregnant? (Ask all females between the ages of 11-55) ---No Is this a behavioral health or substance abuse call? ---No Guidelines Guideline Title Affirmed Question Affirmed Notes Nurse Date/Time (Eastern Time) Neurologic Deficit [1] Numbness or tingling on both sides of body AND [2] is a new symptom present < 24 hours Cantrell, RN, Lanora Manis 01/27/2023 8:58:12 AM Disp. Time Lamount Cohen Time) Disposition Final User 01/27/2023 8:56:21 AM Send to Urgent  Caprice Renshaw 01/27/2023 9:02:57 AM See HCP within 4 Hours (or PCP triage) Yes Cantrell, RN, Lanora Manis Final Disposition 01/27/2023 9:02:57 AM See HCP within 4 Hours (or PCP triage) Yes Cantrell, RN, Lanora Manis Disposition Overriden: Home Care Override Reason: Patient's symptoms need a higher level of care Caller Disagree/Comply Comply Caller Understands Yes PreDisposition Home Care Care Advice Given Per Guideline SEE HCP (OR PCP TRIAGE) WITHIN 4 HOURS: * IF OFFICE WILL BE OPEN: You need to be seen within the next 3 or 4 hours. Call your doctor (or NP/PA) now or as soon as the office opens. CALL BACK IF: * You become worse CARE ADVICE given per Neurologic Deficit (Adult) guideline. Comments User: Patrica Duel, RN Date/Time Lamount Cohen Time): 01/27/2023 9:05:12 AM Upgraded. Caller states tingling in one arm that comes and goes (is not there now.) States has been coming and going for about a week. Denies injury. Referrals Warm transfer to backline

## 2023-01-27 NOTE — Patient Instructions (Signed)
As we discussed I suspect you have a pinched nerve coming from the left side of your neck that is causing some of the arm symptoms.  Sometimes that will respond to some relative rest, gentle stretches and range of motion, anti-inflammatory and sometimes muscle relaxant as needed.  I sent a anti-inflammatory called Mobic to your pharmacy.  Take once per day for the next week or 2.  Do not combine with over-the-counter Aleve or ibuprofen.  If you are having neck soreness or persistent symptoms into the night, can try the muscle relaxant but be careful of sedation and any side effects the next morning.  Do not drive or operate machinery on that medication. Vision changes do not seem to be correlating with the arm symptoms, I would recommend meeting with your optometrist for further evaluation and discussion.   If symptoms are not improving in the next 1 to 2 weeks, or any worsening sooner, follow-up to discuss next steps including possible x-ray.  Follow-up can be with myself if unable to be seen by your primary care provider.  I hope you feel better soon.

## 2023-01-27 NOTE — Progress Notes (Signed)
Subjective:  Patient ID: Xavier Oconnor, male    DOB: [birth year]  Age: 42 y.o. MRN: [id number]  CC:  Chief Complaint  Patient presents with   Tingling    Some numbness mostly tingling only in left arm. Pt noticed it a week ago thinking he slept on it wrong but it has not gone away. No chest pain, no other pain. Did notice some blurred vision. Patient states no new routine in life he currently is off work for couple of weeks.     HPI Xavier Oconnor presents for  Acute visit for left arm symptoms.  PCP, Dr. Mardelle Matte.  Left arm dysesthesias Initially thought he slept on it wrong about a week ago. Noticed during night. Then noticed during the day - comes and goes. Inside of arm to entire hand. No grip weakness, no arm weakness. Symptoms have continued.  No neck pain. No prior neck injury/surgery/injection. No R arm or leg sx's.  No chest pain or other pain. No HA. vision changes. Having to look at phone further away past few weeks. No darkening or loss of vision. No jaw pain/claudication sx's. Plans appt with his optometrist.  No change in thirst or frequent urination.  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  Lab Results  Component Value Date   HGBA1C 5.4 03/12/2022   Hx of lumbar disc disease few years ago.   History Patient Active Problem List   Diagnosis Date Noted   Osteoarthritis, localized, knee 08/20/2022   Herniated lumbar disc without myelopathy 07/13/2018   Obesity (BMI 30-39.9) 10/04/2016   Hyperhidrosis 10/04/2016   Umbilical hernia 10/04/2016   Allergic rhinitis 08/03/2012   Past Medical History:  Diagnosis Date   Allergy    Premature ejaculation 07/16/2019   History reviewed. No pertinent surgical history. No Known Allergies Prior to Admission medications   Medication Sig Start Date End Date Taking? Authorizing Provider  cetirizine (ZYRTEC) 10 MG tablet Take 1 tablet (10 mg total) by mouth daily. 12/30/22  Yes  Ardith Dark, Xavier Oconnor  fluticasone [facility]) 50 MCG/ACT nasal spray USE 1 SPRAY IN EACH NOSTRIL EVERY DAY 12/30/22  Yes Ardith Dark, Xavier Oconnor  triamcinolone ointment (KENALOG) 0.5 % Apply 1 Application topically 2 (two) times daily. 12/30/22  Yes Ardith Dark, Xavier Oconnor   Social History   Socioeconomic History   Marital status: Married    Spouse name: Not on file   Number of children: 2   Years of education: Not on file   Highest education level: Not on file  Occupational History    Employer: [employer]  Tobacco Use   Smoking status: Never   Smokeless tobacco: Never  Vaping Use   Vaping status: Not on file  Substance and Sexual Activity   Alcohol use: Yes    Alcohol/week: 2.0 standard drinks of alcohol    Types: 2 Cans of beer per week    Comment: occ   Drug use: No   Sexual activity: Yes    Birth control/protection: None  Other Topics Concern   Not on file  Social History Narrative   Not on file   Social Determinants of Health   Financial Resource Strain: Not on file  Food Insecurity: Not on file  Transportation Needs: Not on file  Physical Activity: Not on file  Stress: Not on file  Social Connections: Not on file  Intimate Partner Violence: Not on file    Review  of Systems Per HPI.   Objective:   Vitals:   01/27/23 0957  BP: 118/78  Pulse: 71  Temp: 98.8 F (37.1 C)  TempSrc: Oral  SpO2: 96%  Weight: 218 lb 9.6 oz (99.2 kg)  Height: 5\' 7"  (1.702 m)     Physical Exam Vitals reviewed.  Constitutional:      Appearance: He is well-developed.  HENT:     Head: Normocephalic and atraumatic.  Neck:     Vascular: No carotid bruit or JVD.  Cardiovascular:     Rate and Rhythm: Normal rate and regular rhythm.     Heart sounds: Normal heart sounds. No murmur heard. Pulmonary:     Effort: Pulmonary effort is normal.     Breath sounds: Normal breath sounds. No rales.  Musculoskeletal:     Right lower leg: No edema.     Left lower leg: No edema.      Comments: C-spine, slight decreased lateral flexion, rotation, extension.  Discomfort with left lateral flexion and some reproduction of left arm symptoms/dysesthesias. Equal shoulder shrug, upper extremity strength equal bilaterally including grip strength.  No rash.  Skin:    General: Skin is warm and dry.  Neurological:     General: No focal deficit present.     Mental Status: He is alert and oriented to person, place, and time.     GCS: GCS eye subscore is 4. GCS verbal subscore is 5. GCS motor subscore is 6.     Cranial Nerves: No cranial nerve deficit, dysarthria or facial asymmetry.     Sensory: Sensation is intact.     Motor: Motor function is intact. No weakness, tremor or pronator drift.     Coordination: Coordination is intact. Romberg sign negative. Coordination normal. Heel to [facility] Test normal. Rapid alternating movements normal.     Gait: Gait is intact.  Psychiatric:        Mood and Affect: Mood normal.    Results for orders placed or performed in visit on 01/27/23  POCT glucose (manual entry)  Result Value Ref Range   POC Glucose 81 70 - 99 mg/dl     Assessment & Plan:  Xavier Oconnor is a 42 y.o. male . Numbness and tingling in left arm - Plan: meloxicam (MOBIC) 7.5 MG tablet, cyclobenzaprine (FLEXERIL) 5 MG tablet, POCT glucose (manual entry) Neck pain on left side   -Reproducible symptoms with lateral neck flexion.  Suspect component of degenerative disc disease.  Previous lumbar spine disc herniation.  No weakness appreciated.  Intermittent symptoms.  Trial of anti-inflammatory initially, option of muscle relaxant with potential side effects and risks of meds discussed.  Gentle range of motion.  RTC precautions if symptoms not improving in the next week to 10 days.  Sooner if worse.  Hold on imaging at this time.   Vision changes - Plan: POCT glucose (manual entry)  -Likely unrelated to arm symptoms.  Suspect declining near vision, bilateral symptoms, plans on  optometry evaluation.   Meds ordered this encounter  Medications   meloxicam (MOBIC) 7.5 MG tablet    Sig: Take 1 tablet (7.5 mg total) by mouth daily.    Dispense:  15 tablet    Refill:  0   cyclobenzaprine (FLEXERIL) 5 MG tablet    Sig: Take 1 tablet (5 mg total) by mouth at bedtime as needed for muscle spasms (start qhs prn due to sedation).    Dispense:  15 tablet    Refill:  0  Patient Instructions  As we discussed I suspect you have a pinched nerve coming from the left side of your neck that is causing some of the arm symptoms.  Sometimes that will respond to some relative rest, gentle stretches and range of motion, anti-inflammatory and sometimes muscle relaxant as needed.  I sent a anti-inflammatory called Mobic to your pharmacy.  Take once per day for the next week or 2.  Do not combine with over-the-counter Aleve or ibuprofen.  If you are having neck soreness or persistent symptoms into the night, can try the muscle relaxant but be careful of sedation and any side effects the next morning.  Do not drive or operate machinery on that medication. Vision changes do not seem to be correlating with the arm symptoms, I would recommend meeting with your optometrist for further evaluation and discussion.   If symptoms are not improving in the next 1 to 2 weeks, or any worsening sooner, follow-up to discuss next steps including possible x-ray.  Follow-up can be with myself if unable to be seen by your primary care provider.  I hope you feel better soon.    Signed,   Meredith Staggers, Xavier Oconnor [facility], [facility] 01/27/23 10:37 AM

## 2023-01-28 ENCOUNTER — Ambulatory Visit: Payer: Medicaid Other | Admitting: Family Medicine

## 2023-02-11 ENCOUNTER — Ambulatory Visit: Payer: Medicaid Other | Admitting: Family Medicine

## 2023-02-11 ENCOUNTER — Encounter: Payer: Self-pay | Admitting: Family Medicine

## 2023-02-11 ENCOUNTER — Ambulatory Visit
Admission: RE | Admit: 2023-02-11 | Discharge: 2023-02-11 | Disposition: A | Discharge status: Home / Self Care | Payer: Medicaid Other | Source: Ambulatory Visit | Attending: Family Medicine | Admitting: Family Medicine

## 2023-02-11 VITALS — BP 110/70 | HR 77 | Temp 98.3°F | Ht 67.0 in | Wt 221.6 lb

## 2023-02-11 DIAGNOSIS — L309 Dermatitis, unspecified: Secondary | ICD-10-CM

## 2023-02-11 DIAGNOSIS — R202 Paresthesia of skin: Secondary | ICD-10-CM | POA: Insufficient documentation

## 2023-02-11 HISTORY — DX: Dermatitis, unspecified: L30.9

## 2023-02-11 LAB — CBC WITH DIFFERENTIAL/PLATELET
Basophils Absolute: 0 10*3/uL (ref 0.0–0.1)
Basophils Relative: 1.1 % (ref 0.0–3.0)
Eosinophils Absolute: 0.2 10*3/uL (ref 0.0–0.7)
Eosinophils Relative: 5.6 % — ABNORMAL HIGH (ref 0.0–5.0)
HCT: 42.5 % (ref 39.0–52.0)
Hemoglobin: 14.1 g/dL (ref 13.0–17.0)
Lymphocytes Relative: 44.5 % (ref 12.0–46.0)
Lymphs Abs: 1.7 10*3/uL (ref 0.7–4.0)
MCHC: 33.1 g/dL (ref 30.0–36.0)
MCV: 88.4 fL (ref 78.0–100.0)
Monocytes Absolute: 0.5 10*3/uL (ref 0.1–1.0)
Monocytes Relative: 11.8 % (ref 3.0–12.0)
Neutro Abs: 1.5 10*3/uL (ref 1.4–7.7)
Neutrophils Relative %: 37 % — ABNORMAL LOW (ref 43.0–77.0)
Platelets: 249 10*3/uL (ref 150.0–400.0)
RBC: 4.81 Mil/uL (ref 4.22–5.81)
RDW: 12.9 % (ref 11.5–15.5)
WBC: 3.9 10*3/uL — ABNORMAL LOW (ref 4.0–10.5)

## 2023-02-11 LAB — TSH: TSH: 1.91 u[IU]/mL (ref 0.35–5.50)

## 2023-02-11 LAB — B12 AND FOLATE PANEL
Folate: 11.5 ng/mL (ref >5.9)
Vitamin B-12: 197 pg/mL — ABNORMAL LOW (ref 211–911)

## 2023-02-11 MED ORDER — BETAMETHASONE DIPROPIONATE 0.05 % EX CREA: TOPICAL_CREAM | Freq: Two times a day (BID) | CUTANEOUS | 1 refills | Status: DC

## 2023-02-11 NOTE — Patient Instructions (Addendum)
Please return in 1-3 months for your annual complete physical; please come fasting. Your last was in December 2023.  We will call you to get you set up for the nerve studies.  Please go to our [facility] office to get your xrays done. You can walk in M-F between 8:30am- noon or 1pm - 5pm. Tell them you are there for xrays ordered by me. They will send me the results, then I will let you know the results with instructions.   Address: [address].  The Xray department is located in the basement.    I will release your lab results to you on your MyChart account with further instructions. You may see the results before I do, but when I review them I will send you a message with my report or have my assistant call you if things need to be discussed. Please reply to my message with any questions. Thank you!   If you have any questions or concerns, please don't hesitate to send me a message via MyChart or call the office at [phone]. Thank you for visiting with [place] today! It's our pleasure caring for you.   VISIT SUMMARY:  During today's visit, we discussed your ongoing numbness in the left arm and a chronic rash on your leg. We reviewed potential causes and outlined a plan for further diagnostic studies and treatments.  YOUR PLAN:  -LEFT ARM NUMBNESS: Your intermittent numbness in the left arm, which affects all fingers and thumb, may be due to a pinched nerve. We will conduct blood tests to check your vitamin levels and thyroid function, order cervical spine x-rays, and schedule nerve conduction studies of your left arm. You can take over-the-counter vitamin B6 once daily. If your symptoms worsen, we can consider gabapentin, but please message [place] if needed.  -CHRONIC ECZEMA: Your chronic, itchy, and flaky rash on the leg is likely eczema. We will prescribe a stronger topical steroid cream and recommend mixing it with a moisturizing agent like Eucerin or Cetaphil to improve your  symptoms.  INSTRUCTIONS:  Please wait for lab personnel to arrive for your blood work. We will review your x-ray results once they are available and contact you with further instructions based on the diagnostic results. You will receive an after-visit summary and the radiology address via MyChart.

## 2023-02-11 NOTE — Progress Notes (Signed)
Subjective  CC:  Chief Complaint  Patient presents with   arm numbness    Pt stated that he has been experiencing some Lt arm weakness/numbness for the past month and it has gotten worse and he is noticing it more often now.    HPI: Xavier Oconnor is a 42 y.o. male who presents to the office today to address the problems listed above in the chief complaint. Discussed the use of AI scribe software for clinical note transcription with the patient, who gave verbal consent to proceed.  History of Present Illness   The patient presents with a longstanding complaint of numbness in the left arm, described as a sensation akin to the arm "going to sleep." The numbness is intermittent, with no identified positional triggers, and is more noticeable at night and while driving. The numbness extends from the shoulder to all fingers, including the pinky, and is occasionally more intense in the forearm. The patient denies any associated neck pain or weakness, but reports a sensation of weakness in the arm during these episodes. evaluated 10/21: I reviewed note. treated for possible cervical radiculopathy with mobic and mm relaxer. no change in sxs. actually progressing. no pain. has h/o OA in lumbar spine.   In addition, the patient has a chronic, itchy rash on the leg, which appears dry and flaky. Previous treatments have not resulted in significant improvement. The patient has a history of lower back issues, but no surgeries. treated with triamcinolone ointment.      Assessment  1. Paresthesia of left upper extremity   2. Chronic eczema      Plan  Assessment and Plan    Left Arm Numbness Intermittent numbness in the left arm, affecting all fingers and thumb, with no associated neck pain or weakness. Differential diagnosis includes cervical radiculopathy, brachial plexopathy, and peripheral neuropathy. Suspected pinched nerve, possibly originating from the neck or axilla. Discussed potential use of  gabapentin for symptom relief, with the side effect of drowsiness, especially concerning for driving. Patient prefers to proceed with diagnostic studies first. - Order blood work to check vitamin levels and thyroid function - Order cervical spine x-rays - Schedule nerve conduction studies of the left arm - Recommend over-the-counter vitamin B6, one pill daily - Consider gabapentin if symptoms worsen; patient to message if needed  Chronic Eczema Chronic, flaky, and occasionally pruritic rash on the leg. Previous treatment with a petroleum-based ointment (possibly triamcinolone) has not been effective. - Prescribe a stronger topical steroid cream - Recommend mixing the steroid cream with a moisturizing agent like Eucerin or Cetaphil  General Health Maintenance No need for a flu shot at this time.  Follow-up - Send after-visit summary and radiology address via MyChart - Patient to wait for lab personnel to arrive for blood work - Review x-ray results once available - Contact patient with further instructions based on diagnostic results.      Follow up: cpe in 1-3 months Visit date not found  Orders Placed This Encounter  Procedures   DG Cervical Spine Complete   TSH   B12 and Folate Panel   CBC with Differential/Platelet   NCV with EMG(electromyography)   Meds ordered this encounter  Medications   betamethasone dipropionate 0.05 % cream    Sig: Apply topically 2 (two) times daily.    Dispense:  45 g    Refill:  1    Use for 2 weeks, may mix with moisturizer, then as needed  I reviewed the patients updated PMH, FH, and SocHx.    Patient Active Problem List   Diagnosis Date Noted   Paresthesia of left upper extremity 02/11/2023   Chronic eczema 02/11/2023   Osteoarthritis, localized, knee 08/20/2022   Herniated lumbar disc without myelopathy 07/13/2018   Obesity (BMI 30-39.9) 10/04/2016   Hyperhidrosis 10/04/2016   Umbilical hernia 10/04/2016   Allergic rhinitis  08/03/2012   Current Meds  Medication Sig   betamethasone dipropionate 0.05 % cream Apply topically 2 (two) times daily.   cetirizine (ZYRTEC) 10 MG tablet Take 1 tablet (10 mg total) by mouth daily.   cyclobenzaprine (FLEXERIL) 5 MG tablet Take 1 tablet (5 mg total) by mouth at bedtime as needed for muscle spasms (start qhs prn due to sedation).   fluticasone (FLONASE) 50 MCG/ACT nasal spray USE 1 SPRAY IN EACH NOSTRIL EVERY DAY   meloxicam (MOBIC) 7.5 MG tablet Take 1 tablet (7.5 mg total) by mouth daily.   [DISCONTINUED] triamcinolone ointment (KENALOG) 0.5 % Apply 1 Application topically 2 (two) times daily.    Allergies: Patient has No Known Allergies. Family History: Patient family history includes Breast cancer in his sister; Healthy in his brother, daughter, father, and mother. Social History:  Patient  reports that he has never smoked. He has never used smokeless tobacco. He reports current alcohol use of about 2.0 standard drinks of alcohol per week. He reports that he does not use drugs.  Review of Systems: Constitutional: Negative for fever malaise or anorexia Cardiovascular: negative for chest pain Respiratory: negative for SOB or persistent cough Gastrointestinal: negative for abdominal pain  Objective  Vitals: BP 110/70   Pulse 77   Temp 98.3 F (36.8 C)   Ht 5\' 7"  (1.702 m)   Wt 221 lb 9.6 oz (100.5 kg)   SpO2 94%   BMI 34.71 kg/m  General: no acute distress , A&Ox3 HEENT: PEERL, conjunctiva normal, neck is supple nontender.  MSK: good mm tone and bulk. Nl 5/5 strength throughout upper extremities bilaterally Neuro: nl DTRs bilateral upper ext, nl sensation throughout left upper ext Left lateral lower leg with dry flaking rash  Commons side effects, risks, benefits, and alternatives for medications and treatment plan prescribed today were discussed, and the patient expressed understanding of the given instructions. Patient is instructed to call or message via  MyChart if he/she has any questions or concerns regarding our treatment plan. No barriers to understanding were identified. We discussed Red Flag symptoms and signs in detail. Patient expressed understanding regarding what to do in case of urgent or emergency type symptoms.  Medication list was reconciled, printed and provided to the patient in AVS. Patient instructions and summary information was reviewed with the patient as documented in the AVS. This note was prepared with assistance of Dragon voice recognition software. Occasional wrong-word or sound-a-like substitutions may have occurred due to the inherent limitations of voice recognition software

## 2023-02-13 NOTE — Progress Notes (Signed)
Please call patient:has very low B12 levels; this could be contributing to nerve symptoms. Please get set up for vit B12 injections weekly x 4, then monthly. Othe rlabs are fine.

## 2023-02-19 ENCOUNTER — Ambulatory Visit (INDEPENDENT_AMBULATORY_CARE_PROVIDER_SITE_OTHER): Payer: Medicaid Other

## 2023-02-19 DIAGNOSIS — E538 Deficiency of other specified B group vitamins: Secondary | ICD-10-CM | POA: Diagnosis not present

## 2023-02-19 MED ORDER — CYANOCOBALAMIN 1000 MCG/ML IJ SOLN
1000.0000 ug | Freq: Once | INTRAMUSCULAR | Status: AC
  Administered 2023-02-19: 1000 ug via INTRAMUSCULAR

## 2023-02-19 NOTE — Progress Notes (Signed)
Per orders of Dr. Mardelle Matte, injection of 1st weekly B 12 given in left deltoid per patient preference by Jobe Gibbon, CMA. Patient tolerated injection well. Patient reminded to schedule next injection.

## 2023-02-27 ENCOUNTER — Ambulatory Visit: Payer: Medicaid Other

## 2023-02-27 DIAGNOSIS — E538 Deficiency of other specified B group vitamins: Secondary | ICD-10-CM

## 2023-02-27 MED ORDER — FLUTICASONE PROPIONATE 50 MCG/ACT NA SUSP: NASAL | 11 refills | Status: DC

## 2023-02-27 MED ORDER — BETAMETHASONE DIPROPIONATE 0.05 % EX CREA: TOPICAL_CREAM | Freq: Two times a day (BID) | CUTANEOUS | 1 refills | Status: DC

## 2023-02-27 MED ORDER — CYANOCOBALAMIN 1000 MCG/ML IJ SOLN
1000.0000 ug | Freq: Once | INTRAMUSCULAR | Status: AC
  Administered 2023-02-27: 1000 ug via INTRAMUSCULAR

## 2023-02-27 MED ORDER — CETIRIZINE HCL 10 MG PO TABS: 10.0000 mg | ORAL_TABLET | Freq: Every day | ORAL | 3 refills | Status: DC

## 2023-02-27 NOTE — Progress Notes (Addendum)
Patient is in office today for a nurse visit for B12 Injection, per PCP's order. Patient Injection was given in the  Left deltoid. Patient tolerated injection well. Pt stated that he would like his pharmacy changed to [facility] and pt requesting all meds from Dr Mardelle Matte are due for refill. Sent in medications to updated pharmacy. Ok to call in all meds per Elease Hashimoto.

## 2023-02-27 NOTE — Addendum Note (Signed)
Addended by: Samara Deist on: 02/27/2023 10:11 AM   Modules accepted: Orders

## 2023-03-04 ENCOUNTER — Ambulatory Visit (INDEPENDENT_AMBULATORY_CARE_PROVIDER_SITE_OTHER): Payer: Medicaid Other

## 2023-03-04 DIAGNOSIS — E538 Deficiency of other specified B group vitamins: Secondary | ICD-10-CM | POA: Diagnosis not present

## 2023-03-04 MED ORDER — CYANOCOBALAMIN 1000 MCG/ML IJ SOLN
1000.0000 ug | Freq: Once | INTRAMUSCULAR | Status: AC
  Administered 2023-03-04: 1000 ug via INTRAMUSCULAR

## 2023-03-04 NOTE — Progress Notes (Signed)
Patient is in office today for a nurse visit for B12 Injection, per PCP's order. Patient Injection was given in the  Left deltoid. Patient tolerated injection well.

## 2023-03-05 ENCOUNTER — Other Ambulatory Visit (HOSPITAL_COMMUNITY): Payer: Self-pay

## 2023-03-05 ENCOUNTER — Telehealth: Payer: Self-pay

## 2023-03-05 ENCOUNTER — Telehealth: Payer: Self-pay | Admitting: Family Medicine

## 2023-03-05 NOTE — Telephone Encounter (Signed)
Pharmacy Patient Advocate Encounter   Received notification from Pt Calls Messages that prior authorization for Betamethasone Dipropionate 0.05% cream is required/requested.   Insurance verification completed.   The patient is insured through [facility] [place] .   Per test claim: PA required; PA submitted to above mentioned insurance via CoverMyMeds Key/confirmation #/EOC [id number] Status is pending

## 2023-03-05 NOTE — Telephone Encounter (Signed)
Pharmacy Patient Advocate Encounter  Received notification from [facility] Medicaid that Prior Authorization for Betamethasone Dipropionate 0.05% cream has been DENIED.  See denial reason below. No denial letter attached in CMM. Will attach denial letter to Media tab once received.   PA #/Case ID/Reference #: [id number]   Tried a test claim for the Betamethasone valertate ointment 0.1% and received a paid claim for $4.

## 2023-03-05 NOTE — Telephone Encounter (Signed)
Patient called stating he was informed that a PA is needed for the betamethasone dipropionate 0.05% cream. Can this be started?

## 2023-03-11 ENCOUNTER — Ambulatory Visit (INDEPENDENT_AMBULATORY_CARE_PROVIDER_SITE_OTHER): Payer: Medicaid Other

## 2023-03-11 ENCOUNTER — Encounter: Payer: Self-pay | Admitting: Family Medicine

## 2023-03-11 DIAGNOSIS — E538 Deficiency of other specified B group vitamins: Secondary | ICD-10-CM | POA: Diagnosis not present

## 2023-03-11 MED ORDER — BETAMETHASONE VALERATE 0.1 % EX CREA: TOPICAL_CREAM | Freq: Two times a day (BID) | CUTANEOUS | 0 refills | Status: DC

## 2023-03-11 MED ORDER — CYANOCOBALAMIN 1000 MCG/ML IJ SOLN
1000.0000 ug | Freq: Once | INTRAMUSCULAR | Status: AC
  Administered 2023-03-11: 1000 ug via INTRAMUSCULAR

## 2023-03-11 NOTE — Progress Notes (Signed)
.  Patient is in office today for a nurse visit for B12 Injection per Dr. Mardelle Matte. Patient Injection was given in the  Left deltoid. Patient tolerated injection well.  Christy Gentles

## 2023-03-11 NOTE — Addendum Note (Signed)
Addended by: Asencion Partridge on: 03/11/2023 07:48 PM   Modules accepted: Orders

## 2023-04-10 ENCOUNTER — Encounter: Payer: Medicaid Other | Admitting: Family Medicine

## 2023-04-22 ENCOUNTER — Ambulatory Visit: Payer: Medicaid Other | Admitting: Family Medicine

## 2023-04-22 ENCOUNTER — Encounter: Payer: Self-pay | Admitting: Family Medicine

## 2023-04-22 VITALS — BP 114/86 | HR 84 | Temp 97.7°F | Ht 67.0 in | Wt 222.0 lb

## 2023-04-22 DIAGNOSIS — J301 Allergic rhinitis due to pollen: Secondary | ICD-10-CM | POA: Diagnosis not present

## 2023-04-22 DIAGNOSIS — K648 Other hemorrhoids: Secondary | ICD-10-CM

## 2023-04-22 DIAGNOSIS — E669 Obesity, unspecified: Secondary | ICD-10-CM

## 2023-04-22 DIAGNOSIS — R202 Paresthesia of skin: Secondary | ICD-10-CM

## 2023-04-22 DIAGNOSIS — Z0001 Encounter for general adult medical examination with abnormal findings: Secondary | ICD-10-CM

## 2023-04-22 DIAGNOSIS — E538 Deficiency of other specified B group vitamins: Secondary | ICD-10-CM | POA: Diagnosis not present

## 2023-04-22 DIAGNOSIS — M5126 Other intervertebral disc displacement, lumbar region: Secondary | ICD-10-CM

## 2023-04-22 HISTORY — DX: Other hemorrhoids: K64.8

## 2023-04-22 LAB — CBC WITH DIFFERENTIAL/PLATELET
Basophils Absolute: 0 10*3/uL (ref 0.0–0.1)
Basophils Relative: 1.1 % (ref 0.0–3.0)
Eosinophils Absolute: 0.2 10*3/uL (ref 0.0–0.7)
Eosinophils Relative: 5.5 % — ABNORMAL HIGH (ref 0.0–5.0)
HCT: 42.8 % (ref 39.0–52.0)
Hemoglobin: 14.4 g/dL (ref 13.0–17.0)
Lymphocytes Relative: 43.3 % (ref 12.0–46.0)
Lymphs Abs: 1.5 10*3/uL (ref 0.7–4.0)
MCHC: 33.6 g/dL (ref 30.0–36.0)
MCV: 88.5 fL (ref 78.0–100.0)
Monocytes Absolute: 0.4 10*3/uL (ref 0.1–1.0)
Monocytes Relative: 12 % (ref 3.0–12.0)
Neutro Abs: 1.3 10*3/uL — ABNORMAL LOW (ref 1.4–7.7)
Neutrophils Relative %: 38.1 % — ABNORMAL LOW (ref 43.0–77.0)
Platelets: 259 10*3/uL (ref 150.0–400.0)
RBC: 4.84 Mil/uL (ref 4.22–5.81)
RDW: 13.1 % (ref 11.5–15.5)
WBC: 3.5 10*3/uL — ABNORMAL LOW (ref 4.0–10.5)

## 2023-04-22 LAB — COMPREHENSIVE METABOLIC PANEL
ALT: 21 U/L (ref 0–53)
AST: 18 U/L (ref 0–37)
Albumin: 4.5 g/dL (ref 3.5–5.2)
Alkaline Phosphatase: 87 U/L (ref 39–117)
BUN: 13 mg/dL (ref 6–23)
CO2: 25 meq/L (ref 19–32)
Calcium: 9.2 mg/dL (ref 8.4–10.5)
Chloride: 106 meq/L (ref 96–112)
Creatinine, Ser: 0.97 mg/dL (ref 0.40–1.50)
GFR: 94.79 mL/min (ref >60.00)
Glucose, Bld: 82 mg/dL (ref 70–99)
Potassium: 3.9 meq/L (ref 3.5–5.1)
Sodium: 140 meq/L (ref 135–145)
Total Bilirubin: 0.7 mg/dL (ref 0.2–1.2)
Total Protein: 7.3 g/dL (ref 6.0–8.3)

## 2023-04-22 LAB — LIPID PANEL
Cholesterol: 199 mg/dL (ref 0–200)
HDL: 49.7 mg/dL (ref >39.00)
LDL Cholesterol: 138 mg/dL — ABNORMAL HIGH (ref 0–99)
NonHDL: 149.37
Total CHOL/HDL Ratio: 4
Triglycerides: 55 mg/dL (ref 0.0–149.0)
VLDL: 11 mg/dL (ref 0.0–40.0)

## 2023-04-22 LAB — VITAMIN B12: Vitamin B-12: 353 pg/mL (ref 211–911)

## 2023-04-22 MED ORDER — MELOXICAM 7.5 MG PO TABS: 7.5000 mg | ORAL_TABLET | Freq: Every day | ORAL | 5 refills | Status: AC | PRN

## 2023-04-22 MED ORDER — CYCLOBENZAPRINE HCL 5 MG PO TABS: 5.0000 mg | ORAL_TABLET | Freq: Every evening | ORAL | 0 refills | Status: AC | PRN

## 2023-04-22 MED ORDER — CYANOCOBALAMIN 1000 MCG/ML IJ SOLN
1000.0000 ug | Freq: Once | INTRAMUSCULAR | Status: AC
  Administered 2023-04-22: 1000 ug via INTRAMUSCULAR

## 2023-04-22 MED ORDER — CETIRIZINE HCL 10 MG PO TABS: 10.0000 mg | ORAL_TABLET | Freq: Every day | ORAL | 3 refills | Status: DC

## 2023-04-22 MED ORDER — FLUTICASONE PROPIONATE 50 MCG/ACT NA SUSP: NASAL | 11 refills | Status: DC

## 2023-04-22 MED ORDER — BETAMETHASONE VALERATE 0.1 % EX CREA: TOPICAL_CREAM | Freq: Two times a day (BID) | CUTANEOUS | 0 refills | Status: DC

## 2023-04-22 NOTE — Progress Notes (Signed)
 Subjective  Chief Complaint  Patient presents with   Annual Exam    HPI: Xavier Oconnor is a 43 y.o. male who presents to [facility] [place] today for a Male Wellness Visit. He also has the concerns and/or needs as listed above in the chief complaint. These will be addressed in addition to the Health Maintenance Visit.   Wellness Visit: annual visit with health maintenance review and exam   HM: had colonoscopy in 2019; reviewed, internal hemorrhoid otherwise normal. Next screen recommended at age 89. Avg risk. 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 Declines vaccinations.   Body mass index is 34.77 kg/m. Wt Readings from Last 3 Encounters:  04/22/23 222 lb (100.7 kg)  02/11/23 221 lb 9.6 oz (100.5 kg)  01/27/23 218 lb 9.6 oz (99.2 kg)     Chronic disease management visit and/or acute problem visit: B12 deficiency now on IM supplements and due for recheck. No anemia Paresthesias: improving. Still with some in hand intermittently but less intense and less frequent. No new sxs Back pain is stable. Likes mm relaxer and nsaid on hand if needed. No new sxs Allergies are controlled on meds. Needs refills.  Working on weight loss. No sxs of hyperglycemia. Due for lipid screen. Fasting.   Assessment  1. Encounter for well adult exam with abnormal findings   2. Seasonal allergic rhinitis due to pollen   3. Herniated lumbar disc without myelopathy   4. B12 deficiency   5. Paresthesia of left upper extremity   6. Obesity (BMI 30-39.9)      Plan  Male Wellness Visit: Age appropriate Health Maintenance and Prevention measures were discussed with patient. Included topics are cancer screening recommendations, ways to keep healthy (see AVS) including dietary and exercise recommendations, regular eye and dental care, use of seat belts, and avoidance of moderate alcohol use and tobacco use. CRC screen is current.  BMI: discussed patient's BMI and encouraged  positive lifestyle modifications to help get to or maintain a target BMI. HM needs and immunizations were addressed and ordered. See below for orders. See HM and immunization section for updates. Routine labs and screening tests ordered including cmp, cbc and lipids where appropriate. Discussed recommendations regarding Vit D and calcium supplementation (see AVS)  Chronic disease f/u and/or acute problem visit: (deemed necessary to be done in addition to the wellness visit): SAR: continue steroid nasal spray and oral antihistamine Refilled mobic  and flexeril  for prn use Paresthesias likely related to b12 deficiency, improving. Recheck b12 levels. Consider emg/ncv if worsens again. Diff dx: cts, cervical radiculopathy, ulnar neuropathy etc.  Discussed healthy diet changes and exercise.   Follow up: 12 mo for cpe [facility] side effects, risks, benefits, and alternatives for medications and treatment plan prescribed today were discussed, and the patient expressed understanding of the given instructions. Patient is instructed to call or message via MyChart if he/she has any questions or concerns regarding our treatment plan. No barriers to understanding were identified. We discussed Red Flag symptoms and signs in detail. Patient expressed understanding regarding what to do in case of urgent or emergency type symptoms.  Medication list was reconciled, printed and provided to the patient in AVS. Patient instructions and summary information was reviewed with the patient as documented in the AVS. This note was prepared with assistance of Dragon voice recognition software. Occasional wrong-word or sound-a-like substitutions may have occurred due to the inherent limitations of voice recognition software  Orders Placed This  Encounter  Procedures   Lipid panel   Comprehensive metabolic panel   CBC with Differential/Platelet   Vitamin B12   Meds ordered this encounter  Medications   betamethasone  valerate  (VALISONE ) 0.1 % cream    Sig: Apply topically 2 (two) times daily.    Dispense:  45 g    Refill:  0   cetirizine  (ZYRTEC ) 10 MG tablet    Sig: Take 1 tablet (10 mg total) by mouth daily.    Dispense:  90 tablet    Refill:  3   cyclobenzaprine  (FLEXERIL ) 5 MG tablet    Sig: Take 1 tablet (5 mg total) by mouth at bedtime as needed for muscle spasms (start qhs prn due to sedation).    Dispense:  15 tablet    Refill:  0   meloxicam  (MOBIC ) 7.5 MG tablet    Sig: Take 1 tablet (7.5 mg total) by mouth daily as needed for pain.    Dispense:  30 tablet    Refill:  5   fluticasone  (FLONASE ) 50 MCG/ACT nasal spray    Sig: USE 1 SPRAY IN EACH NOSTRIL EVERY DAY    Dispense:  16 g    Refill:  11     Patient Active Problem List   Diagnosis Date Noted   Paresthesia of left upper extremity 02/11/2023   Chronic eczema 02/11/2023   Osteoarthritis, localized, knee 08/20/2022   Herniated lumbar disc without myelopathy 07/13/2018   Obesity (BMI 30-39.9) 10/04/2016   Hyperhidrosis 10/04/2016   Umbilical hernia 10/04/2016   Allergic rhinitis 08/03/2012   Health Maintenance  Topic Date Due   DTaP/Tdap/Td (2 - Td or Tdap) 06/28/2027   Hepatitis C Screening  Completed   HIV Screening  Completed   HPV VACCINES  Aged Out   INFLUENZA VACCINE  Discontinued   COVID-19 Vaccine  Discontinued   Immunization History  Administered Date(s) Administered   Tdap 06/27/2017   We updated and reviewed the patient's past history in detail and it is documented below. Allergies: Patient has no known allergies. Past Medical History  has a past medical history of Allergy and Premature ejaculation (07/16/2019). Past Surgical History Patient  has no past surgical history on file. Social History Patient  reports that he has never smoked. He has never used smokeless tobacco. He reports current alcohol use of about 2.0 standard drinks of alcohol per week. He reports that he does not use drugs. Family  History family history includes Breast cancer in his sister; Healthy in his brother, daughter, father, and mother. Review of Systems: Constitutional: negative for fever or malaise Ophthalmic: negative for photophobia, double vision or loss of vision Cardiovascular: negative for chest pain, dyspnea on exertion, or new LE swelling Respiratory: negative for SOB or persistent cough Gastrointestinal: negative for abdominal pain, change in bowel habits or melena Genitourinary: negative for dysuria or gross hematuria Musculoskeletal: negative for new gait disturbance or muscular weakness Integumentary: negative for new or persistent rashes Neurological: negative for TIA or stroke symptoms Psychiatric: negative for SI or delusions Allergic/Immunologic: negative for hives  Patient Care Team    Relationship Specialty Notifications Start End  Jodie Lavern CROME, MD PCP - General Family Medicine  07/16/19   Kemp, [facility] Physician Specialist  05/22/18    Objective  Vitals: BP 114/86   Pulse 84   Temp 97.7 F (36.5 C)   Ht 5' 7 (1.702 m)   Wt 222 lb (100.7 kg)   SpO2 94%   BMI 34.77  kg/m  General:  Well developed, well nourished, no acute distress  Psych:  Alert and orientedx3,normal mood and affect HEENT:  Normocephalic, atraumatic, non-icteric sclera,  oropharynx is clear without mass or exudate, supple neck without adenopathy, or thyromegaly Cardiovascular:  Normal S1, S2, RRR without gallop, rub or murmur,  Respiratory:  Good breath sounds bilaterally, CTAB with normal respiratory effort Gastrointestinal: normal bowel sounds, soft, non-tender, no noted masses. No HSM MSK: Joints are without erythema or swelling.  Skin:  Warm, no rashes Neurologic:    Mental status is normal.  Gross motor and sensory exams are normal. Stable gait. No tremor

## 2023-04-22 NOTE — Patient Instructions (Signed)
Please return in 12 months for your annual complete physical; please come fasting.  ? ?I will release your lab results to you on your MyChart account with further instructions. You may see the results before I do, but when I review them I will send you a message with my report or have my assistant call you if things need to be discussed. Please reply to my message with any questions. Thank you!  ? ?If you have any questions or concerns, please don't hesitate to send me a message via MyChart or call the office at [phone]. Thank you for visiting with us today! It's our pleasure caring for you.  ? ?Please do these things to maintain good health! ? ?Exercise at least 30-45 minutes a day,  4-5 days a week.  ?Eat a low-fat diet with lots of fruits and vegetables, up to 7-9 servings per day. ?Drink plenty of water daily. Try to drink 8 8oz glasses per day. ?Seatbelts can save your life. Always wear your seatbelt. ?Place Smoke Detectors on every level of your home and check batteries every year. ?Eye Doctor - have an eye exam every 1-2 years ?Safe sex - use condoms to protect yourself from STDs if you could be exposed to these types of infections. ?Avoid heavy alcohol use. If you drink, keep it to less than 2 drinks/day and not every day. ?Health Care Power of Attorney.  Choose someone you trust that could speak for you if you became unable to speak for yourself. ?Depression is common in our stressful world.If you're feeling down or losing interest in things you normally enjoy, please come in for a visit.  ?

## 2023-04-25 NOTE — Progress Notes (Signed)
 Labs reviewed.  The 10-year ASCVD risk score (Arnett DK, et al., 2019) is: 3.2%   Values used to calculate the score:     Age: 43 years     Sex: Male     Is Non-Hispanic African American: Yes     Diabetic: No     Tobacco smoker: No     Systolic Blood Pressure: 114 mmHg     Is BP treated: No     HDL Cholesterol: 49.7 mg/dL     Total Cholesterol: 199 mg/dL

## 2023-04-30 ENCOUNTER — Telehealth: Payer: Medicaid Other | Admitting: Physician Assistant

## 2023-04-30 DIAGNOSIS — J069 Acute upper respiratory infection, unspecified: Secondary | ICD-10-CM | POA: Diagnosis not present

## 2023-04-30 MED ORDER — PROMETHAZINE-DM 6.25-15 MG/5ML PO SYRP: 5.0000 mL | ORAL_SOLUTION | Freq: Four times a day (QID) | ORAL | 0 refills | Status: DC | PRN

## 2023-04-30 MED ORDER — ALBUTEROL SULFATE HFA 108 (90 BASE) MCG/ACT IN AERS: 2.0000 | INHALATION_SPRAY | Freq: Four times a day (QID) | RESPIRATORY_TRACT | 0 refills | Status: DC | PRN

## 2023-04-30 NOTE — Progress Notes (Signed)
I have spent 5 minutes in review of e-visit questionnaire, review and updating patient chart, medical decision making and response to patient.   Mia Milan Cody Jacklynn Dehaas, PA-C    

## 2023-04-30 NOTE — Progress Notes (Signed)
E-Visit for Cough   We are sorry that you are not feeling well.  Here is how we plan to help!  Based on your presentation I believe you most likely have A cough due to a virus.  This is called viral bronchitis and is best treated by rest, plenty of fluids and control of the cough.  You may use Ibuprofen or Tylenol as directed to help your symptoms.     In addition I have sent in an albuterol inhaler to help relax your airway and resolve the wheezing, and a prescription cough medication - promethazine-dm - to take as directed.  From your responses in the eVisit questionnaire you describe inflammation in the upper respiratory tract which is causing a significant cough.  This is commonly called Bronchitis and has four common causes:   Allergies Viral Infections Acid Reflux Bacterial Infection Allergies, viruses and acid reflux are treated by controlling symptoms or eliminating the cause. An example might be a cough caused by taking certain blood pressure medications. You stop the cough by changing the medication. Another example might be a cough caused by acid reflux. Controlling the reflux helps control the cough.  USE OF BRONCHODILATOR ("RESCUE") INHALERS: There is a risk from using your bronchodilator too frequently.  The risk is that over-reliance on a medication which only relaxes the muscles surrounding the breathing tubes can reduce the effectiveness of medications prescribed to reduce swelling and congestion of the tubes themselves.  Although you feel brief relief from the bronchodilator inhaler, your asthma may actually be worsening with the tubes becoming more swollen and filled with mucus.  This can delay other crucial treatments, such as oral steroid medications. If you need to use a bronchodilator inhaler daily, several times per day, you should discuss this with your provider.  There are probably better treatments that could be used to keep your asthma under control.     HOME CARE Only  take medications as instructed by your medical team. Complete the entire course of an antibiotic. Drink plenty of fluids and get plenty of rest. Avoid close contacts especially the very young and the elderly Cover your mouth if you cough or cough into your sleeve. Always remember to wash your hands A steam or ultrasonic humidifier can help congestion.   GET HELP RIGHT AWAY IF: You develop worsening fever. You become short of breath You cough up blood. Your symptoms persist after you have completed your treatment plan MAKE SURE YOU  Understand these instructions. Will watch your condition. Will get help right away if you are not doing well or get worse.    Thank you for choosing an e-visit.  Your e-visit answers were reviewed by a board certified advanced clinical practitioner to complete your personal care plan. Depending upon the condition, your plan could have included both over the counter or prescription medications.  Please review your pharmacy choice. Make sure the pharmacy is open so you can pick up prescription now. If there is a problem, you may contact your provider through [employer] and have the prescription routed to another pharmacy.  Your safety is important to [place]. If you have drug allergies check your prescription carefully.   For the next 24 hours you can use MyChart to ask questions about today's visit, request a non-urgent call back, or ask for a work or school excuse. You will get an email in the next two days asking about your experience. I hope that your e-visit has been valuable and will  speed your recovery.

## 2023-05-02 ENCOUNTER — Ambulatory Visit
Admission: EM | Admit: 2023-05-02 | Discharge: 2023-05-02 | Disposition: A | Discharge status: Home / Self Care | Payer: Medicaid Other | Attending: Physician Assistant | Admitting: Physician Assistant

## 2023-05-02 ENCOUNTER — Other Ambulatory Visit: Payer: Self-pay

## 2023-05-02 DIAGNOSIS — J069 Acute upper respiratory infection, unspecified: Secondary | ICD-10-CM

## 2023-05-02 LAB — POC COVID19/FLU A&B COMBO
Covid Antigen, POC: NEGATIVE
Influenza A Antigen, POC: NEGATIVE
Influenza B Antigen, POC: NEGATIVE

## 2023-05-02 NOTE — ED Provider Notes (Signed)
[facility]    CSN: [id number] Arrival date & time: 05/02/23  1201      History   Chief Complaint Chief Complaint  Patient presents with   Cough   chest congestion    HPI Macdonald T Paredez is a 43 y.o. male.   Patient here concerned with cough x 5 days.  He reprots night time wheezing.  Denies f/c, SOB, n/v/d.  No advil or tylenol today.  He was prescribed albuterol via telehealth but he hasn't taken it yet.     Past Medical History:  Diagnosis Date   Allergy    Premature ejaculation 07/16/2019    Patient Active Problem List   Diagnosis Date Noted   Internal hemorrhoid, bleeding 04/22/2023   B12 deficiency 04/22/2023   Paresthesia of left upper extremity 02/11/2023   Chronic eczema 02/11/2023   Osteoarthritis, localized, knee 08/20/2022   Herniated lumbar disc without myelopathy 07/13/2018   Obesity (BMI 30-39.9) 10/04/2016   Hyperhidrosis 10/04/2016   Umbilical hernia 10/04/2016   Allergic rhinitis 08/03/2012    History reviewed. No pertinent surgical history.     Home Medications    Prior to Admission medications   Medication Sig Start Date End Date Taking? Authorizing Provider  albuterol (VENTOLIN HFA) 108 (90 Base) MCG/ACT inhaler Inhale 2 puffs into the lungs every 6 (six) hours as needed for wheezing or shortness of breath. 04/30/23   Waldon Merl, PA-C  betamethasone dipropionate 0.05 % cream Apply topically 2 (two) times daily. 02/27/23   Willow Ora, MD  betamethasone valerate (VALISONE) 0.1 % cream Apply topically 2 (two) times daily. 04/22/23   Willow Ora, MD  cetirizine (ZYRTEC) 10 MG tablet Take 1 tablet (10 mg total) by mouth daily. 04/22/23   Willow Ora, MD  cyclobenzaprine (FLEXERIL) 5 MG tablet Take 1 tablet (5 mg total) by mouth at bedtime as needed for muscle spasms (start qhs prn due to sedation). 04/22/23   Willow Ora, MD  fluticasone [facility]) 50 MCG/ACT nasal spray USE 1 SPRAY IN [facility] NOSTRIL EVERY DAY  04/22/23   Willow Ora, MD  meloxicam (MOBIC) 7.5 MG tablet Take 1 tablet (7.5 mg total) by mouth daily as needed for pain. 04/22/23   Willow Ora, MD  promethazine-dextromethorphan (PROMETHAZINE-DM) 6.25-15 MG/5ML syrup Take 5 mLs by mouth 4 (four) times daily as needed for cough. 04/30/23   Waldon Merl, PA-C    Family History Family History  Problem Relation Age of Onset   Healthy Mother    Healthy Father    Breast cancer Sister    Healthy Brother    Healthy Daughter    Colon cancer Neg Hx    Esophageal cancer Neg Hx    Rectal cancer Neg Hx    Stomach cancer Neg Hx     Social History Social History   Tobacco Use   Smoking status: Never   Smokeless tobacco: Never  Substance Use Topics   Alcohol use: Yes    Alcohol/week: 2.0 standard drinks of alcohol    Types: 2 Cans of beer per week    Comment: occ   Drug use: No     Allergies   Patient has no known allergies.   Review of Systems Review of Systems  Constitutional:  Negative for chills, fatigue and fever.  HENT:  Negative for congestion, ear pain, nosebleeds, postnasal drip, rhinorrhea, sinus pressure, sinus pain and sore throat.   Eyes:  Negative for pain and redness.  Respiratory:  Positive for cough and wheezing. Negative for shortness of breath.   Gastrointestinal:  Negative for abdominal pain, diarrhea, nausea and vomiting.  Musculoskeletal:  Negative for arthralgias and myalgias.  Skin:  Negative for rash.  Neurological:  Negative for light-headedness and headaches.  Hematological:  Negative for adenopathy. Does not bruise/bleed easily.  Psychiatric/Behavioral:  Negative for confusion and sleep disturbance.      Physical Exam Triage Vital Signs ED Triage Vitals  Encounter Vitals Group     BP 05/02/23 1250 121/80     Systolic BP Percentile --      Diastolic BP Percentile --      Pulse Rate 05/02/23 1250 94     Resp 05/02/23 1250 18     Temp 05/02/23 1250 99.4 F (37.4 C)     Temp Source  05/02/23 1250 Oral     SpO2 05/02/23 1250 93 %     Weight --      Height --      Head Circumference --      Peak Flow --      Pain Score 05/02/23 1249 0     Pain Loc --      Pain Education --      Exclude from Growth Chart --    No data found.  Updated Vital Signs BP 121/80 (BP Location: Left Arm)   Pulse 94   Temp 99.4 F (37.4 C) (Oral)   Resp 18   SpO2 93%   Visual Acuity Right Eye Distance:   Left Eye Distance:   Bilateral Distance:    Right Eye Near:   Left Eye Near:    Bilateral Near:     Physical Exam Vitals and nursing note reviewed.  Constitutional:      General: He is not in acute distress.    Appearance: Normal appearance. He is not ill-appearing.  HENT:     Head: Normocephalic and atraumatic.     Nose: No congestion or rhinorrhea.  Eyes:     General: No scleral icterus.    Extraocular Movements: Extraocular movements intact.     Conjunctiva/sclera: Conjunctivae normal.  Cardiovascular:     Rate and Rhythm: Normal rate and regular rhythm.     Heart sounds: No murmur heard. Pulmonary:     Effort: Pulmonary effort is normal. No respiratory distress.     Breath sounds: Normal breath sounds. No wheezing or rales.  Musculoskeletal:     Cervical back: Normal range of motion. No rigidity.  Lymphadenopathy:     Cervical: No cervical adenopathy.  Skin:    Capillary Refill: Capillary refill takes less than 2 seconds.     Coloration: Skin is not jaundiced.     Findings: No rash.  Neurological:     General: No focal deficit present.     Mental Status: He is alert and oriented to person, place, and time.     Motor: No weakness.     Gait: Gait normal.  Psychiatric:        Mood and Affect: Mood normal.        Behavior: Behavior normal.      UC Treatments / Results  Labs (all labs ordered are listed, but only abnormal results are displayed) Labs Reviewed  POC COVID19/FLU A&B COMBO    EKG   Radiology No results found.  Procedures Procedures  (including critical care time)  Medications Ordered in UC Medications - No data to display  Initial Impression / Assessment and Plan /  UC Course  I have reviewed the triage vital signs and the nursing notes.  Pertinent labs & imaging results that were available during my care of the patient were reviewed by me and considered in my medical decision making (see chart for details).     Likely viral cough, start albuterol No sx suggestive of pneumonia today, cxr not indicated at this time Return if sx worsen or fail to improve Final Clinical Impressions(s) / UC Diagnoses   Final diagnoses:  Viral URI     Discharge Instructions      Use albuterol as prescribed    ED Prescriptions   None    PDMP not reviewed this encounter.   Evern Core, PA-C 05/02/23 1341

## 2023-05-02 NOTE — Discharge Instructions (Signed)
Use albuterol as prescribed.

## 2023-05-02 NOTE — ED Triage Notes (Signed)
Pt is here with chest congestion and cough that started 5 days ago, pt had a e-visit on Tuesday and was prescribed meds. Pt has taken the prescribed meds to relieve discomfort.

## 2023-05-12 ENCOUNTER — Ambulatory Visit: Payer: Medicaid Other | Admitting: Family Medicine

## 2023-05-14 ENCOUNTER — Ambulatory Visit: Payer: Medicaid Other | Admitting: Family Medicine

## 2023-05-14 ENCOUNTER — Encounter: Payer: Self-pay | Admitting: Family Medicine

## 2023-05-14 VITALS — BP 114/76 | HR 82 | Temp 97.9°F | Ht 67.0 in | Wt 219.2 lb

## 2023-05-14 DIAGNOSIS — L739 Follicular disorder, unspecified: Secondary | ICD-10-CM | POA: Diagnosis not present

## 2023-05-14 MED ORDER — DOXYCYCLINE HYCLATE 100 MG PO TABS: 100.0000 mg | ORAL_TABLET | Freq: Two times a day (BID) | ORAL | 0 refills | Status: DC

## 2023-05-14 NOTE — Progress Notes (Signed)
 Subjective  CC:  Chief Complaint  Patient presents with   Rash    Pt has a spot on both his leg and not sure where it came from but it is tender to the touch and it keeps coming back in the same area    HPI: Xavier Oconnor is a 43 y.o. male who presents to the office today to address the problems listed above in the chief complaint. Discussed the use of AI scribe software for clinical note transcription with the patient, who gave verbal consent to proceed.  History of Present Illness   Xavier Oconnor is a 43 year old male who presents with recurrent skin lesions suspected to be folliculitis.  He experiences recurrent skin lesions, described as spots that develop into knots approximately the size of a dime. These lesions occur on both sides of his body and are associated with ingrown hairs. The spots remain even after the initial inflammation subsides.  The lesions become painful, especially when walking, due to rubbing against clothing. He describes the pain as feeling like 'a needle was sticking' him. An episode on Saturday was significant enough to impede walking.  He attempted to relieve the pain by pulling out an ingrown hair, which provided some relief, although there was no drainage from the lesion. He has not used any topical treatments on the lesions previously.  He currently uses [employer] for hygiene and denies any recent changes in physical activity or exercise that might contribute to the lesions.       Assessment  1. Folliculitis      Plan  Assessment and Plan    Folliculitis Recurrent episodes of painful, inflamed hair follicles on both sides of the groin. No systemic symptoms. Likely due to bacterial infection. -Start a course of oral antibiotics for one week. -Change to an antibacterial soap (e.g., Lever 2000) for at least three months. -Apply a topical antibiotic (e.g., Neosporin, bacitracin) to any red, irritated areas. -Use a warm compress for symptomatic  relief if needed. -Contact the office if symptoms worsen or if a large, painful abscess develops.        No orders of the defined types were placed in this encounter.  No orders of the defined types were placed in this encounter.    I reviewed the patients updated PMH, FH, and SocHx.    Patient Active Problem List   Diagnosis Date Noted   Paresthesia of left upper extremity 02/11/2023    Priority: Medium    Osteoarthritis, localized, knee 08/20/2022    Priority: Medium    Herniated lumbar disc without myelopathy 07/13/2018    Priority: Medium    Obesity (BMI 30-39.9) 10/04/2016    Priority: Medium    Umbilical hernia 10/04/2016    Priority: Medium    Internal hemorrhoid, bleeding 04/22/2023    Priority: Low   B12 deficiency 04/22/2023    Priority: Low   Chronic eczema 02/11/2023    Priority: Low   Hyperhidrosis 10/04/2016    Priority: Low   Allergic rhinitis 08/03/2012    Priority: Low   Current Meds  Medication Sig   albuterol  (VENTOLIN  HFA) 108 (90 Base) MCG/ACT inhaler Inhale 2 puffs into the lungs every 6 (six) hours as needed for wheezing or shortness of breath.   betamethasone  dipropionate 0.05 % cream Apply topically 2 (two) times daily.   betamethasone  valerate (VALISONE ) 0.1 % cream Apply topically 2 (two) times daily.   cetirizine  (ZYRTEC ) 10 MG tablet Take 1  tablet (10 mg total) by mouth daily.   cyclobenzaprine  (FLEXERIL ) 5 MG tablet Take 1 tablet (5 mg total) by mouth at bedtime as needed for muscle spasms (start qhs prn due to sedation).   fluticasone  (FLONASE ) 50 MCG/ACT nasal spray USE 1 SPRAY IN EACH NOSTRIL EVERY DAY   meloxicam  (MOBIC ) 7.5 MG tablet Take 1 tablet (7.5 mg total) by mouth daily as needed for pain.   promethazine -dextromethorphan (PROMETHAZINE -DM) 6.25-15 MG/5ML syrup Take 5 mLs by mouth 4 (four) times daily as needed for cough.    Allergies: Patient has no known allergies. Family History: Patient family history includes Breast  cancer in his sister; Healthy in his brother, daughter, father, and mother. Social History:  Patient  reports that he has never smoked. He has never used smokeless tobacco. He reports current alcohol use of about 2.0 standard drinks of alcohol per week. He reports that he does not use drugs.  Review of Systems: Constitutional: Negative for fever malaise or anorexia Cardiovascular: negative for chest pain Respiratory: negative for SOB or persistent cough Gastrointestinal: negative for abdominal pain  Objective  Vitals: BP 114/76   Pulse 82   Temp 97.9 F (36.6 C)   Ht 5' 7 (1.702 m)   Wt 219 lb 3.2 oz (99.4 kg)   SpO2 95%   BMI 34.33 kg/m  General: no acute distress , A&Ox3 Skin:  Warm, no rashes Upper thighs with healing small pustules.   Commons side effects, risks, benefits, and alternatives for medications and treatment plan prescribed today were discussed, and the patient expressed understanding of the given instructions. Patient is instructed to call or message via MyChart if he/she has any questions or concerns regarding our treatment plan. No barriers to understanding were identified. We discussed Red Flag symptoms and signs in detail. Patient expressed understanding regarding what to do in case of urgent or emergency type symptoms.  Medication list was reconciled, printed and provided to the patient in AVS. Patient instructions and summary information was reviewed with the patient as documented in the AVS. This note was prepared with assistance of Dragon voice recognition software. Occasional wrong-word or sound-a-like substitutions may have occurred due to the inherent limitations of voice recognition software

## 2023-05-14 NOTE — Patient Instructions (Signed)
 Please follow up as scheduled for your next visit with me: Visit date not found   If you have any questions or concerns, please don't hesitate to send me a message via MyChart or call the office at [phone]. Thank you for visiting with us  today! It's our pleasure caring for you.   Folliculitis  Folliculitis occurs when hair follicles become inflamed. A hair follicle is a tiny opening in your skin where your hair grows from. This condition often occurs on the scalp, thighs, legs, back, and buttocks but can happen anywhere on the body. What are the causes? A common cause of this condition is an infection from bacteria. The type of folliculitis caused by bacteria can last a long time or go away and come back. The bacteria can live anywhere on your skin. They are often found in the nostrils. Other causes may include: An infection from a fungus. An infection from a virus. Your skin touching some chemicals, such as oils and tars. Shaving or waxing. Greasy ointments or creams put on the skin. What increases the risk? You are more likely to develop this condition if: Your body has a weak disease-fighting system (immune system). You have diabetes. You are obese. What are the signs or symptoms? Symptoms of this condition include: Redness. Soreness. Swelling. Itching. Small white or yellow, itchy spots filled with pus (pustules) that appear over a red area. If the infection goes deep into the follicle, these may turn into a boil (furuncle). A group of boils (carbuncle). These tend to form in hairy, sweaty areas of the body. How is this diagnosed? This condition is diagnosed with a skin exam. Your health care provider may take a sample of one of the pustules or boils to test in a lab. How is this treated? This condition may be treated by: Putting a warm, wet cloth (warm compress) on the affected areas. Taking antibiotics or applying them to the skin. Applying or bathing with a solution that  kills germs (antiseptic). Taking an over-the-counter medicine. This can help with itching. Having a procedure to drain pustules or boils. This may be done if a pustule or boil contains a lot of pus or fluid. Having laser hair removal. This may be done when the condition lasts for a long time. Follow these instructions at home: Managing pain and swelling  If directed, apply heat to the affected area as often as told by your health care provider. Use the heat source that your health care provider recommends, such as a moist heat pack or a heating pad. Place a towel between your skin and the heat source. Leave the heat on for 20-30 minutes. If your skin turns bright red, remove the heat right away to prevent burns. The risk of burns is higher if you cannot feel pain, heat, or cold. General instructions Take over-the-counter and prescription medicines only as told by your health care provider. If you were prescribed antibiotics, take or apply them as told by your health care provider. Do not stop using the antibiotic even if you start to feel better. Check your irritated area every day for signs of infection. Check for: More redness, swelling, or pain. Fluid or blood. Warmth. Pus or a bad smell. Do not shave irritated skin. Keep all follow-up visits. Your health care provider will check if the treatments are helping. Contact a health care provider if: You have a fever. You have any signs of infection. Red streaks are spreading from the affected area. This information  is not intended to replace advice given to you by your health care provider. Make sure you discuss any questions you have with your health care provider. Document Revised: 08/28/2021 Document Reviewed: 08/28/2021 Elsevier Patient Education  2024 [employer].

## 2023-05-15 ENCOUNTER — Other Ambulatory Visit: Payer: Self-pay

## 2023-05-15 ENCOUNTER — Telehealth: Payer: Self-pay

## 2023-05-15 MED ORDER — DOXYCYCLINE HYCLATE 100 MG PO TABS: 100.0000 mg | ORAL_TABLET | Freq: Two times a day (BID) | ORAL | 0 refills | Status: AC

## 2023-05-15 NOTE — Telephone Encounter (Signed)
 Copied from CRM [id number]. Topic: Clinical - Prescription Issue >> May 14, 2023  4:28 PM Alfonso ORN wrote: Reason for CRM: Patient was seen today by Jodie Betters and was prescribe some medication and want to make sure that the refill is going to  [id number], [place] - [address] [place] [address] Phone: [phone] Fax: [phone] Hours: Not open 24 hours  Duplicate note; Rx sent to correct pharmacy

## 2023-05-15 NOTE — Telephone Encounter (Signed)
 Copied from CRM [id number]. Topic: Clinical - Prescription Issue >> May 14, 2023  4:31 PM Alfonso ORN wrote: Reason for CRM: per patient please send medication  doxycycline  (VIBRA -TABS) 100 MG tablet to [id number], [place] - [address] [place] [address] Phone: [phone] Fax: [phone] Hours: Not open 24 hours  Do not use [facility]  Sent recent prescription for Doxycycline  to [facility] as requested.

## 2023-05-20 IMAGING — US US EXTREM LOW*L* LIMITED
1 series · 14 of 15 positions shown · non-contrast
Comparison: None.

CLINICAL DATA: Left groin swelling.

EXAM:
ULTRASOUND left LOWER EXTREMITY LIMITED
TECHNIQUE: Ultrasound examination of the lower extremity soft tissues was
performed in the area of clinical concern.

[Series 1: us extrem low*left* limited · 15 acquisitions, 14 frames shown]
[im 1/15]
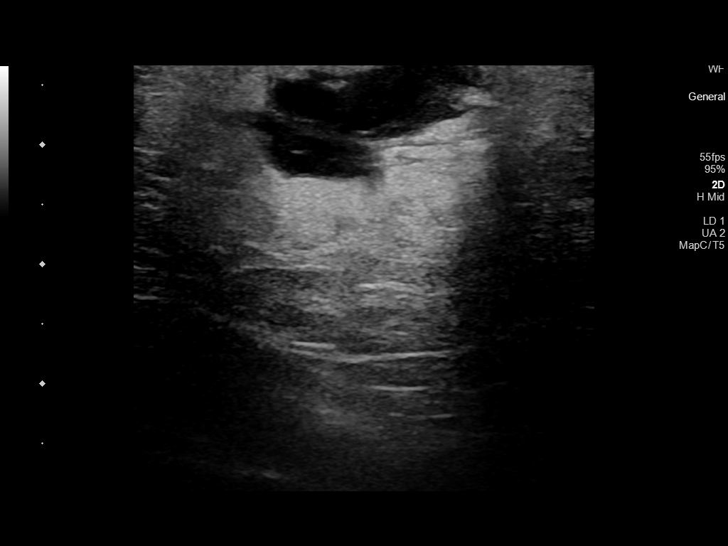
[im 2/15]
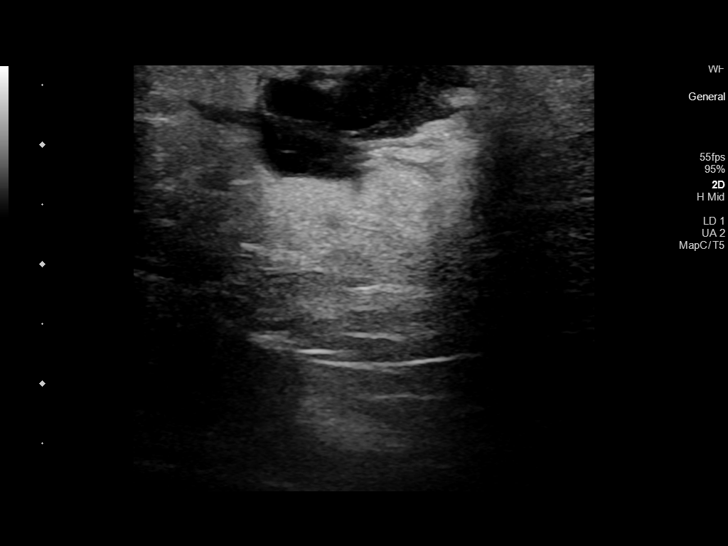
[im 3/15]
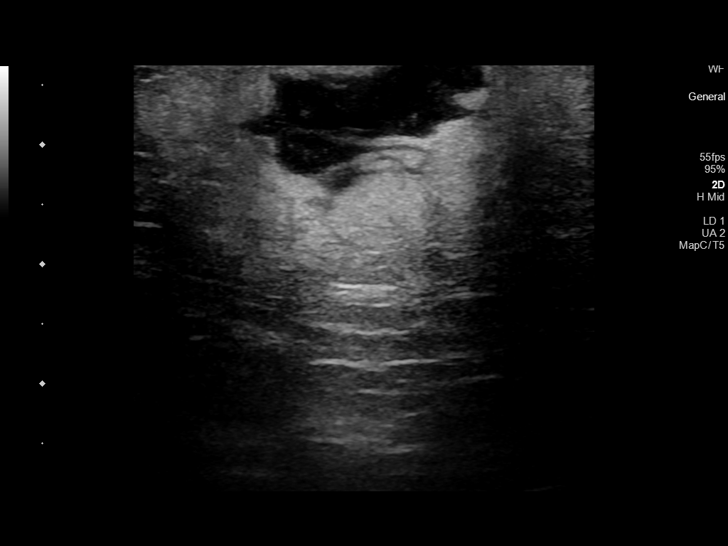
[im 4/15]
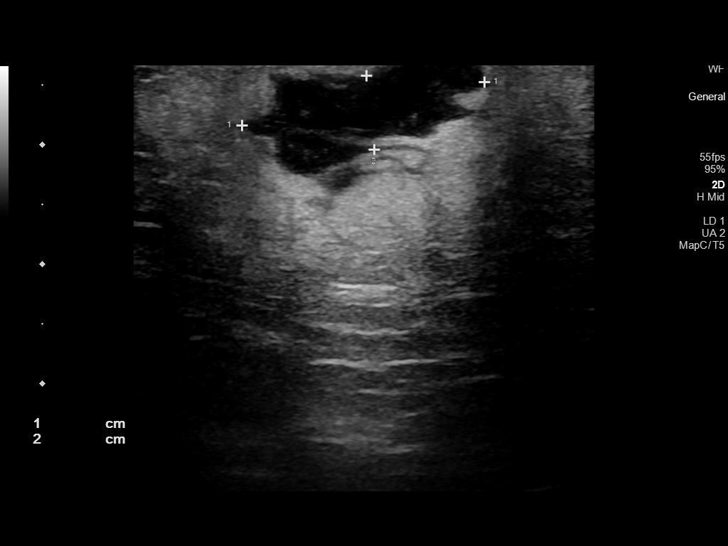
[im 5/15]
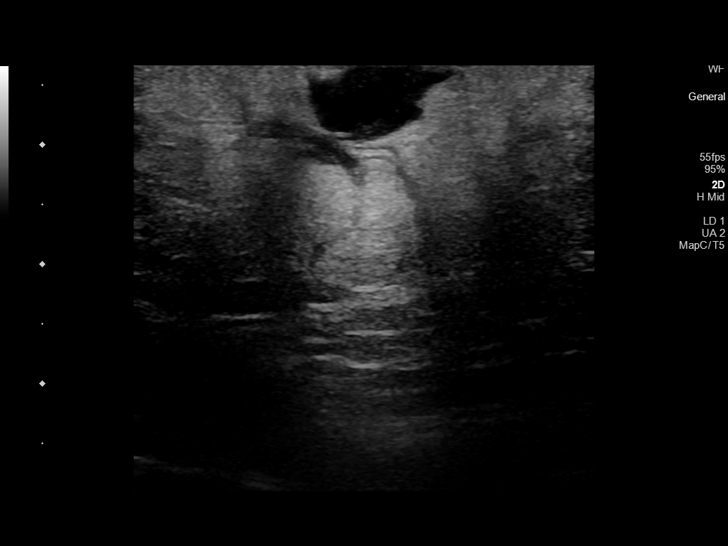
[im 6/15]
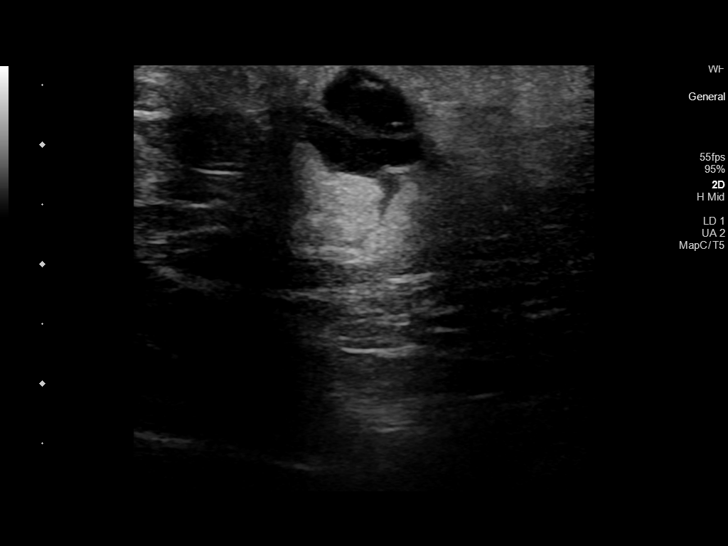
[im 7/15]
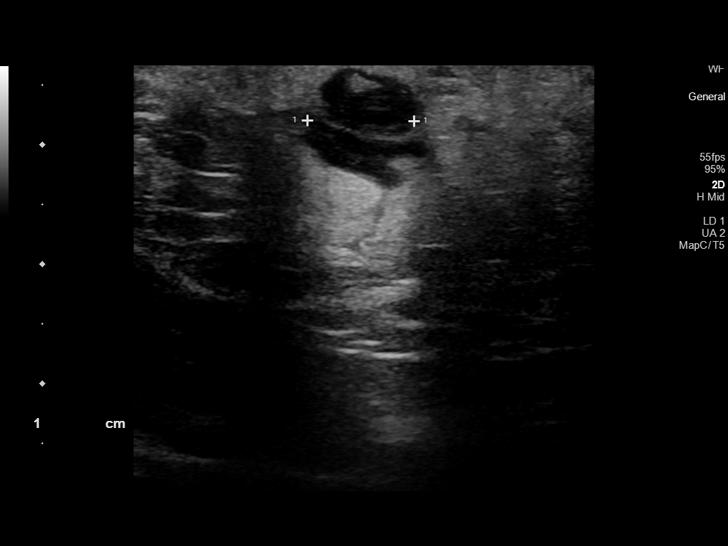
[im 9/15]
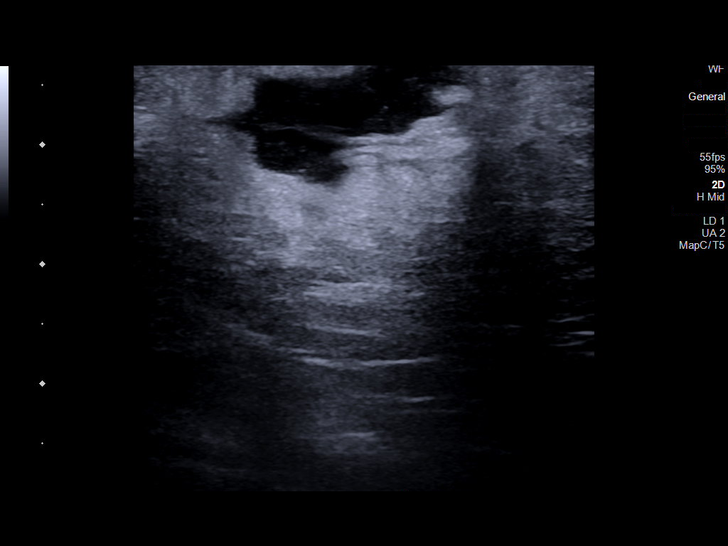
[im 10/15]
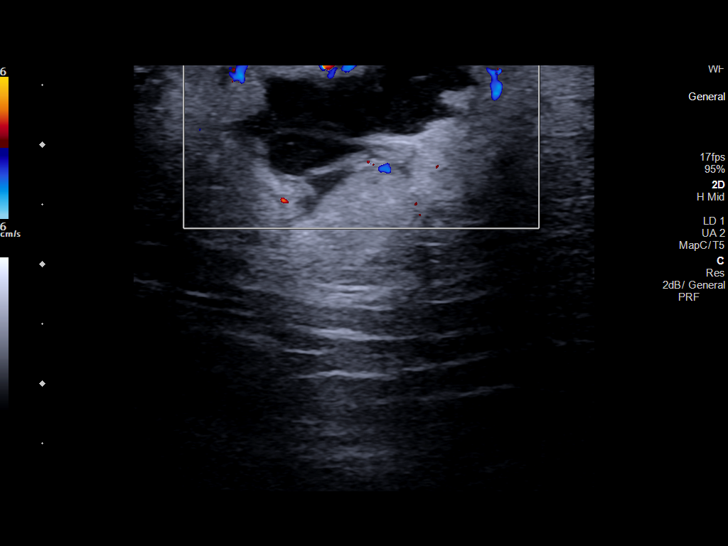
[im 11/15]
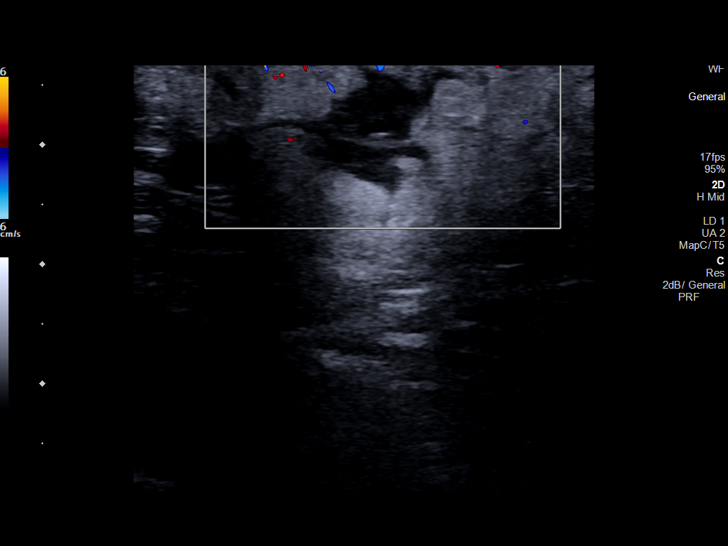
[im 12/15]
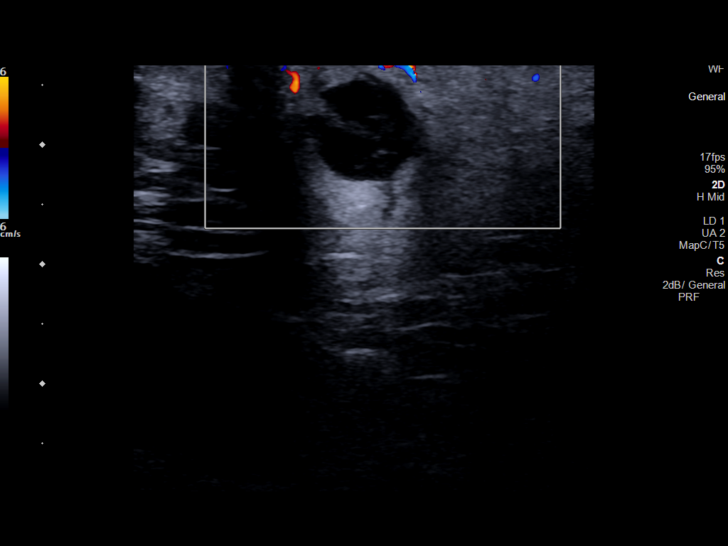
[im 13/15]
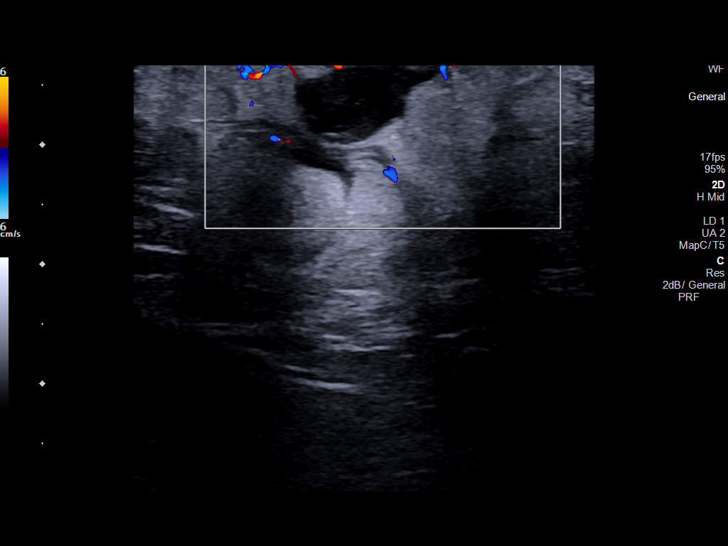
[im 14/15]
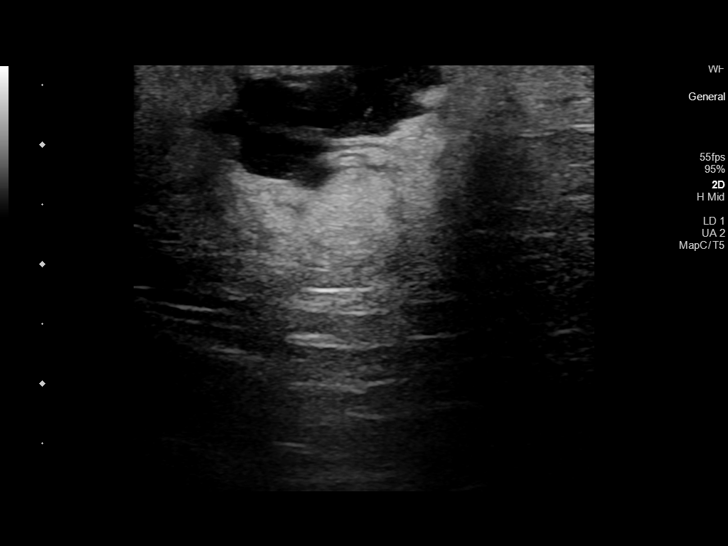
[im 15/15]
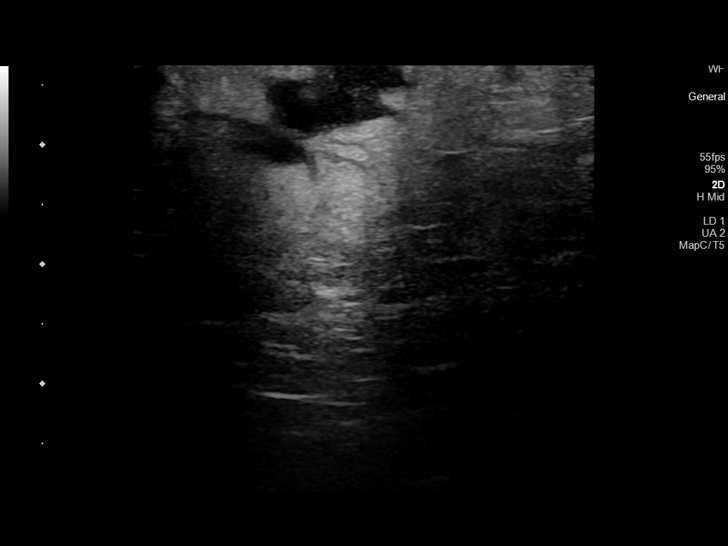

[14 of 15 positions shown; findings below may reference images not displayed]

FINDINGS: Complex but predominantly hypoechoic abnormality measuring 2.0 x
x 0.6 cm is noted in the left groin region which contains internal
echoes and is concerning for complex fluid collection such as
hematoma or abscess. No definite hernia is noted. Doppler
demonstrates no internal blood flow with this abnormality.
IMPRESSION: 2.0 x 0.9 x 0.6 cm complex but predominantly hypoechoic abnormality
is noted in the left groin region which is concerning for complex
fluid collection such as hematoma or abscess.

## 2023-05-22 ENCOUNTER — Ambulatory Visit: Payer: Medicaid Other

## 2024-04-14 ENCOUNTER — Ambulatory Visit: Admitting: Family Medicine

## 2024-04-27 ENCOUNTER — Encounter: Payer: Medicaid Other | Admitting: Family Medicine

## 2024-05-13 ENCOUNTER — Encounter: Payer: Self-pay | Admitting: Family Medicine

## 2024-05-13 ENCOUNTER — Ambulatory Visit: Admitting: Family Medicine

## 2024-05-13 VITALS — BP 114/64 | HR 83 | Temp 98.2°F | Ht 67.0 in | Wt 224.0 lb

## 2024-05-13 DIAGNOSIS — Z0001 Encounter for general adult medical examination with abnormal findings: Secondary | ICD-10-CM

## 2024-05-13 DIAGNOSIS — E538 Deficiency of other specified B group vitamins: Secondary | ICD-10-CM

## 2024-05-13 DIAGNOSIS — J301 Allergic rhinitis due to pollen: Secondary | ICD-10-CM

## 2024-05-13 DIAGNOSIS — M5126 Other intervertebral disc displacement, lumbar region: Secondary | ICD-10-CM

## 2024-05-13 DIAGNOSIS — L309 Dermatitis, unspecified: Secondary | ICD-10-CM

## 2024-05-13 DIAGNOSIS — L739 Follicular disorder, unspecified: Secondary | ICD-10-CM

## 2024-05-13 LAB — CBC WITH DIFFERENTIAL/PLATELET
Basophils Absolute: 0 10*3/uL (ref 0.0–0.1)
Basophils Relative: 1.3 % (ref 0.0–3.0)
Eosinophils Absolute: 0.2 10*3/uL (ref 0.0–0.7)
Eosinophils Relative: 4.9 % (ref 0.0–5.0)
HCT: 42.7 % (ref 39.0–52.0)
Hemoglobin: 14.4 g/dL (ref 13.0–17.0)
Lymphocytes Relative: 45.8 % (ref 12.0–46.0)
Lymphs Abs: 1.7 10*3/uL (ref 0.7–4.0)
MCHC: 33.6 g/dL (ref 30.0–36.0)
MCV: 87.8 fl (ref 78.0–100.0)
Monocytes Absolute: 0.4 10*3/uL (ref 0.1–1.0)
Monocytes Relative: 10.5 % (ref 3.0–12.0)
Neutro Abs: 1.4 10*3/uL (ref 1.4–7.7)
Neutrophils Relative %: 37.5 % — ABNORMAL LOW (ref 43.0–77.0)
Platelets: 256 10*3/uL (ref 150.0–400.0)
RBC: 4.86 Mil/uL (ref 4.22–5.81)
RDW: 13.1 % (ref 11.5–15.5)
WBC: 3.7 10*3/uL — ABNORMAL LOW (ref 4.0–10.5)

## 2024-05-13 LAB — LIPID PANEL
Cholesterol: 191 mg/dL (ref 28–200)
HDL: 48.7 mg/dL
LDL Cholesterol: 129 mg/dL — ABNORMAL HIGH (ref 10–99)
NonHDL: 142.46
Total CHOL/HDL Ratio: 4
Triglycerides: 69 mg/dL (ref 10.0–149.0)
VLDL: 13.8 mg/dL (ref 0.0–40.0)

## 2024-05-13 LAB — COMPREHENSIVE METABOLIC PANEL WITH GFR
ALT: 20 U/L (ref 3–53)
AST: 14 U/L (ref 5–37)
Albumin: 4.4 g/dL (ref 3.5–5.2)
Alkaline Phosphatase: 88 U/L (ref 39–117)
BUN: 13 mg/dL (ref 6–23)
CO2: 31 meq/L (ref 19–32)
Calcium: 9.4 mg/dL (ref 8.4–10.5)
Chloride: 107 meq/L (ref 96–112)
Creatinine, Ser: 0.93 mg/dL (ref 0.40–1.50)
GFR: 98.97 mL/min
Glucose, Bld: 89 mg/dL (ref 70–99)
Potassium: 4.6 meq/L (ref 3.5–5.1)
Sodium: 140 meq/L (ref 135–145)
Total Bilirubin: 0.7 mg/dL (ref 0.2–1.2)
Total Protein: 7.1 g/dL (ref 6.0–8.3)

## 2024-05-13 LAB — VITAMIN B12: Vitamin B-12: 286 pg/mL (ref 211–911)

## 2024-05-13 LAB — TSH: TSH: 1.62 u[IU]/mL (ref 0.35–5.50)

## 2024-05-13 MED ORDER — DOXYCYCLINE HYCLATE 100 MG PO TABS: 100.0000 mg | ORAL_TABLET | Freq: Two times a day (BID) | ORAL | 0 refills | Status: AC

## 2024-05-13 MED ORDER — CETIRIZINE HCL 10 MG PO TABS: 10.0000 mg | ORAL_TABLET | Freq: Every day | ORAL | 3 refills | Status: AC

## 2024-05-13 MED ORDER — FLUTICASONE PROPIONATE 50 MCG/ACT NA SUSP: NASAL | 11 refills | Status: AC

## 2024-05-13 MED ORDER — BETAMETHASONE VALERATE 0.1 % EX CREA: TOPICAL_CREAM | Freq: Two times a day (BID) | CUTANEOUS | 2 refills | Status: AC | PRN

## 2024-05-13 NOTE — Patient Instructions (Signed)
 Please return in 12 months for your annual complete physical; please come fasting.   I will release your lab results to you on your MyChart account with further instructions. You may see the results before I do, but when I review them I will send you a message with my report or have my assistant call you if things need to be discussed. Please reply to my message with any questions. Thank you!   If you have any questions or concerns, please don't hesitate to send me a message via MyChart or call the office at [phone]. Thank you for visiting with us  today! It's our pleasure caring for you.    VISIT SUMMARY: During your visit, we addressed your recurrent skin pustules, skin dryness, and reviewed your resolved vitamin B12 deficiency. We also discussed your allergic rhinitis and general health maintenance.  YOUR PLAN: -FOLLICULITIS: Folliculitis is an infection of the hair follicles that causes small, red, pus-filled bumps. We prescribed a one-week course of antibiotics to reduce the bacterial load and advised you to switch to an antibacterial soap such as Labor 2000 or Dial. You have been referred to dermatology for further evaluation and management.  -XEROSIS (SKIN DRYNESS): Xerosis is a condition of dry skin. Continue using Vaseline for moisture and apply the stronger steroid cream when areas become red and inflamed. We will consider prescribing a new topical cream if needed.  -VITAMIN B12 DEFICIENCY: Vitamin B12 deficiency can cause symptoms like numbness and tingling. Your deficiency has been resolved with oral supplementation. We checked your B12 levels to ensure they remain within the normal range. Continue taking your oral B12 supplements if your levels are low.  -ALLERGIC RHINITIS: Allergic rhinitis is an allergic reaction that causes sneezing, congestion, and a runny nose. Continue taking cetirizine  10 mg daily and use fluticasone  nasal spray daily, especially starting in mid-February to  manage spring symptoms.  -GENERAL HEALTH MAINTENANCE: We discussed routine health maintenance, including fasting labs and monitoring your B12 levels. We performed fasting labs to monitor your overall health and B12 levels.  INSTRUCTIONS: Please follow up with dermatology for further evaluation of your folliculitis. Continue taking your medications as prescribed and use the antibacterial soap as advised. We will review your lab results during your next visit.    Contains text generated by [employer].  ,

## 2024-05-13 NOTE — Progress Notes (Signed)
 " Subjective  Chief Complaint  Patient presents with   Annual Exam    Pt here for Annual Exam and is not currently fasting     HPI: Xavier Oconnor is a 44 y.o. male who presents to [facility] [place] [facility] today for a Male Wellness Visit. He also has the concerns and/or needs as listed above in the chief complaint. These will be addressed in addition to the Health Maintenance Visit.   Wellness Visit: annual visit with health maintenance review and exam   HM: screens are current. Declines vaccines. Fairly healthy lifestyle.   Body mass index is 35.08 kg/m. Wt Readings from Last 3 Encounters:  05/13/24 224 lb (101.6 kg)  05/14/23 219 lb 3.2 oz (99.4 kg)  04/22/23 222 lb (100.7 kg)   Chronic disease management visit and/or acute problem visit: Discussed the use of AI scribe software for clinical note transcription with the patient, who gave verbal consent to proceed.  History of Present Illness Xavier Oconnor is a 44 year old male who presents with recurrent skin pustules and dryness.  Cutaneous pustules and hyperpigmentation: folliculitis - Recurrent small, knot-like pustules that become erythematous and resolve in a few days - Lesions leave behind dark spots (post-inflammatory hyperpigmentation) - Lesions resemble boils but do not drain - Previously informed that these may be folliculitis  Xerosis (skin dryness) and chronic eczema - Persistent skin dryness, worsened during winter months - Applies Vaseline to affected areas, especially when dry - Uses Dove soap for cleansing - Ran out of prescribed topical cream steroid ointment and has been using Vaseline instead - Uses a stronger steroid cream when areas are erythematous - No frequent scratching  Vitamin b12 deficiency (resolved) - History of B12 deficiency previously treated with injections - Symptoms included paresthesias in the arm, now resolved - no longer taking oral B12 chewables from [facility] - No  recurrence of symptoms since switching to oral supplementation  Allergies are controlled but need meds to restart for the spring: zyrtec  and flonase   Back pain: uses nsaids. No new flares or sxs.    Assessment  1. Encounter for well adult exam with abnormal findings   2. B12 deficiency   3. Herniated lumbar disc without myelopathy   4. Chronic eczema   5. Folliculitis   6. Seasonal allergic rhinitis due to pollen      Plan  Male Wellness Visit: Age appropriate Health Maintenance and Prevention measures were discussed with patient. Included topics are cancer screening recommendations, ways to keep healthy (see AVS) including dietary and exercise recommendations, regular eye and dental care, use of seat belts, and avoidance of moderate alcohol use and tobacco use.  BMI: discussed patient's BMI and encouraged positive lifestyle modifications to help get to or maintain a target BMI. HM needs and immunizations were addressed and ordered. See below for orders. See HM and immunization section for updates. Routine labs and screening tests ordered including cmp, cbc and lipids where appropriate. Discussed recommendations regarding Vit D and calcium supplementation (see AVS)  Chronic disease f/u and/or acute problem visit: (deemed necessary to be done in addition to the wellness visit): Assessment and Plan Assessment & Plan Folliculitis Recurrent folliculitis with pustules that resolve but leave hyperpigmented marks. Likely due to bacterial infection in hair follicles. Current management with Dove soap may not be sufficient. - Prescribed a one-week course of antibiotics to reduce bacterial load. doxy - Advised switching to an antibacterial soap such as lever 2000 or Dial.  Eczema: peristent. Routine guidance given. Prn steroid, vaseling for moisturizing - Referred to dermatology for further evaluation and management.  Vitamin B12 deficiency Previously managed with injections, now resolved  with oral supplementation. No current symptoms of paresthesia or other deficiency-related symptoms. - Checked B12 levels to ensure he remains within normal range. - Continue oral B12 supplementation if levels are low.  Allergic rhinitis Managed with cetirizine  and fluticasone  nasal spray. Symptoms are anticipated to worsen in the spring. - Continue cetirizine  10 mg daily. - Use fluticasone  nasal spray daily, especially starting in mid-February to manage spring symptoms.  Chronic low back pain managed with nsaids and prn mm relaxers due to DJD> stable.     Follow up: 12 mo for cpe [facility] side effects, risks, benefits, and alternatives for medications and treatment plan prescribed today were discussed, and the patient expressed understanding of the given instructions. Patient is instructed to call or message via MyChart if he/she has any questions or concerns regarding our treatment plan. No barriers to understanding were identified. We discussed Red Flag symptoms and signs in detail. Patient expressed understanding regarding what to do in case of urgent or emergency type symptoms.  Medication list was reconciled, printed and provided to the patient in AVS. Patient instructions and summary information was reviewed with the patient as documented in the AVS. This note was prepared with assistance of Dragon voice recognition software. Occasional wrong-word or sound-a-like substitutions may have occurred due to the inherent limitations of voice recognition software  Orders Placed This Encounter  Procedures   TSH   CBC with Differential/Platelet   Comprehensive metabolic panel with GFR   Lipid panel   Vitamin B12   Ambulatory referral to Dermatology   Meds ordered this encounter  Medications   cetirizine  (ZYRTEC ) 10 MG tablet    Sig: Take 1 tablet (10 mg total) by mouth daily.    Dispense:  90 tablet    Refill:  3   betamethasone  valerate (VALISONE ) 0.1 % cream    Sig: Apply topically 2  (two) times daily as needed.    Dispense:  45 g    Refill:  2   fluticasone  (FLONASE ) 50 MCG/ACT nasal spray    Sig: USE 1 SPRAY IN EACH NOSTRIL EVERY DAY    Dispense:  16 g    Refill:  11     Patient Active Problem List   Diagnosis Date Noted   Paresthesia of left upper extremity 02/11/2023   Osteoarthritis, localized, knee 08/20/2022   Herniated lumbar disc without myelopathy 07/13/2018   Obesity (BMI 30-39.9) 10/04/2016   Umbilical hernia 10/04/2016   Internal hemorrhoid, bleeding 04/22/2023   B12 deficiency 04/22/2023   Chronic eczema 02/11/2023   Hyperhidrosis 10/04/2016   Allergic rhinitis 08/03/2012   Health Maintenance  Topic Date Due   Hepatitis B Vaccines 19-59 Average Risk (1 of 3 - 19+ 3-dose series) Never done   DTaP/Tdap/Td (2 - Td or Tdap) 06/28/2027   Colonoscopy  10/18/2027   HPV VACCINES (No Doses Required) Completed   Hepatitis C Screening  Completed   HIV Screening  Completed   Pneumococcal Vaccine  Aged Out   Meningococcal B Vaccine  Aged Out   Influenza Vaccine  Discontinued   COVID-19 Vaccine  Discontinued   Immunization History  Administered Date(s) Administered   Tdap 06/27/2017   We updated and reviewed the patient's past history in detail and it is documented below. Allergies: Patient has no known allergies. Past Medical History  has a  past medical history of Allergy, Chronic eczema (02/11/2023), Herniated lumbar disc without myelopathy (07/13/2018), Hyperhidrosis (10/04/2016), Internal hemorrhoid, bleeding (04/22/2023), Osteoarthritis, localized, knee (08/20/2022), Premature ejaculation (07/16/2019), and Umbilical hernia (10/04/2016). Past Surgical History Patient  has no past surgical history on file. Social History Patient  reports that he has never smoked. He has never used smokeless tobacco. He reports current alcohol use of about 2.0 standard drinks of alcohol per week. He reports that he does not use drugs. Family History family  history includes Breast cancer in his sister; Healthy in his brother, daughter, father, and mother. Review of Systems: Constitutional: negative for fever or malaise Ophthalmic: negative for photophobia, double vision or loss of vision Cardiovascular: negative for chest pain, dyspnea on exertion, or new LE swelling Respiratory: negative for SOB or persistent cough Gastrointestinal: negative for abdominal pain, change in bowel habits or melena Genitourinary: negative for dysuria or gross hematuria Musculoskeletal: negative for new gait disturbance or muscular weakness Integumentary: negative for new or persistent rashes Neurological: negative for TIA or stroke symptoms Psychiatric: negative for SI or delusions Allergic/Immunologic: negative for hives  Patient Care Team    Relationship Specialty Notifications Start End  Jodie Lavern CROME, MD PCP - General Family Medicine  07/16/19   Ortho, [facility] Consulting Physician Specialist  05/22/18   Abran Norleen SAILOR, MD Consulting Physician Gastroenterology  04/22/23   Duwayne Purchase, MD Consulting Physician Orthopedic Surgery  04/22/23    Objective  Vitals: BP 114/64   Pulse 83   Temp 98.2 F (36.8 C)   Ht 5' 7 (1.702 m)   Wt 224 lb (101.6 kg)   SpO2 98%   BMI 35.08 kg/m  General:  Well developed, well nourished, no acute distress  Psych:  Alert and orientedx3,normal mood and affect HEENT:  Normocephalic, atraumatic, non-icteric sclera,  oropharynx is clear without mass or exudate, supple neck without adenopathy, or thyromegaly Cardiovascular:  Normal S1, S2, RRR without gallop, rub or murmur,  Respiratory:  Good breath sounds bilaterally, CTAB with normal respiratory effort Gastrointestinal: normal bowel sounds, soft, non-tender, no noted masses. No HSM MSK: Joints are without erythema or swelling.  Skin:  Warm, hyperpigemted macules on bilateral upper legs; small tender pustule left posterior thigh, no fluctuance. Right lateral calf with  hyperpigmented dry flaking large macule Neurologic:    Mental status is normal.  Gross motor and sensory exams are normal. Stable gait. No tremor     "

## 2024-05-14 ENCOUNTER — Ambulatory Visit: Payer: Self-pay | Admitting: Family Medicine

## 2024-05-14 NOTE — Progress Notes (Signed)
 See mychart note The 10-year ASCVD risk score (Arnett DK, et al., 2019) is: 3.3%   Values used to calculate the score:     Age: 44 years     Clinically relevant sex: Male     Is Non-Hispanic African American: Yes     Diabetic: No     Tobacco smoker: No     Systolic Blood Pressure: 114 mmHg     Is BP treated: No     HDL Cholesterol: 48.7 mg/dL     Total Cholesterol: 191 mg/dL

## 2025-05-19 ENCOUNTER — Encounter: Admitting: Family Medicine
